# Patient Record
Sex: Male | Born: 1970 | Race: White | Hispanic: No | Marital: Married | Smoking: Never smoker
Health system: Southern US, Community
[De-identification: ages and names within clinical notes are randomized; demographics above are authoritative.]

## PROBLEM LIST (undated history)

## (undated) ENCOUNTER — Emergency Department (HOSPITAL_COMMUNITY): Payer: 59 | Source: Home / Self Care

## (undated) DIAGNOSIS — I10 Essential (primary) hypertension: Secondary | ICD-10-CM

## (undated) HISTORY — DX: Essential (primary) hypertension: I10

## (undated) HISTORY — PX: HERNIA REPAIR: SHX51

---

## 1999-05-18 ENCOUNTER — Ambulatory Visit (HOSPITAL_BASED_OUTPATIENT_CLINIC_OR_DEPARTMENT_OTHER): Admission: RE | Admit: 1999-05-18 | Discharge: 1999-05-18 | Payer: Self-pay | Admitting: Urology

## 2000-06-06 ENCOUNTER — Ambulatory Visit (HOSPITAL_BASED_OUTPATIENT_CLINIC_OR_DEPARTMENT_OTHER): Admission: RE | Admit: 2000-06-06 | Discharge: 2000-06-06 | Payer: Self-pay | Admitting: Urology

## 2001-06-11 ENCOUNTER — Encounter: Payer: Self-pay | Admitting: Specialist

## 2001-06-11 ENCOUNTER — Ambulatory Visit (HOSPITAL_COMMUNITY): Admission: RE | Admit: 2001-06-11 | Discharge: 2001-06-11 | Payer: Self-pay | Admitting: Specialist

## 2001-11-05 ENCOUNTER — Emergency Department (HOSPITAL_COMMUNITY): Admission: EM | Admit: 2001-11-05 | Discharge: 2001-11-05 | Payer: Self-pay | Admitting: *Deleted

## 2003-12-03 ENCOUNTER — Emergency Department (HOSPITAL_COMMUNITY): Admission: EM | Admit: 2003-12-03 | Discharge: 2003-12-03 | Payer: Self-pay | Admitting: Emergency Medicine

## 2004-11-07 ENCOUNTER — Emergency Department (HOSPITAL_COMMUNITY): Admission: EM | Admit: 2004-11-07 | Discharge: 2004-11-07 | Payer: Self-pay | Admitting: Family Medicine

## 2010-04-24 ENCOUNTER — Emergency Department (HOSPITAL_COMMUNITY): Admission: EM | Admit: 2010-04-24 | Discharge: 2010-04-24 | Payer: Self-pay | Admitting: Family Medicine

## 2011-07-04 ENCOUNTER — Telehealth: Payer: Self-pay | Admitting: *Deleted

## 2011-07-04 ENCOUNTER — Ambulatory Visit (INDEPENDENT_AMBULATORY_CARE_PROVIDER_SITE_OTHER): Payer: 59 | Admitting: Sports Medicine

## 2011-07-04 ENCOUNTER — Other Ambulatory Visit: Payer: Self-pay | Admitting: *Deleted

## 2011-07-04 VITALS — BP 124/88 | Ht 72.0 in | Wt 170.0 lb

## 2011-07-04 DIAGNOSIS — M722 Plantar fascial fibromatosis: Secondary | ICD-10-CM

## 2011-07-04 DIAGNOSIS — I10 Essential (primary) hypertension: Secondary | ICD-10-CM | POA: Insufficient documentation

## 2011-07-04 DIAGNOSIS — M79673 Pain in unspecified foot: Secondary | ICD-10-CM

## 2011-07-04 DIAGNOSIS — M79609 Pain in unspecified limb: Secondary | ICD-10-CM

## 2011-07-04 MED ORDER — MELOXICAM 15 MG PO TABS: 15.0000 mg | ORAL_TABLET | Freq: Every day | ORAL | Status: AC

## 2011-07-04 NOTE — Assessment & Plan Note (Signed)
I am hopeful that the conservative care with arch support and straps may lessen the swelling and help him get rhythm nodules. If not I think we should move ahead to a custom orthotic. He should do the icing at the end of each day.  Once these heal somewhat we will start him on some exercises for the plantar fascia as well.

## 2011-07-04 NOTE — Progress Notes (Signed)
  Subjective:    Patient ID: Thomas Kramer, male    DOB: [birth year], 41 y.o.   MRN: [id number]  HPI 1. Foot pain: Patient here with complaint of bilateral foot pain left greater than right. States pain in his left foot began approximately one and a half years ago and pain in right foot began approximately 3 months ago. Pain is most prominent in middle of long arches bilaterally. Says he does have pain when first getting up the morning and standing, with some "pulling" in his Achilles. Currently is employed at [employer] is required to wear steel toed shoes at work.   Review of Systems     Objective:   Physical Exam Gen: NAD L Foot:   Inspection and palpation: one large nodule on plantar aspect of mid-food, tender to palpation.  No tenderness along achilles or insertion of PF at calcaneus No swelling and no pain along metatarsal heads No pain with palpation of bony structures of mid foot: High long arch, transverse arch normal  R Foot Inspection and palpation: small nodule on plantar aspect of mid-food, tender to palpation.  No tenderness along achilles or insertion of PF at calcaneus No swelling and no pain along metatarsal heads No pain with palpation of bony structures of mid foot: High long arch, transverse arch normal  MSK [place]:   L Foot:  PF mildly thickened at insertion to calcaneus (0.6cm).  There are multiple fluid filled nodules in the mid arch medially. Plantar fascia also thickened to 0.3 cm at this area. Right foot: Plantar fascia at the calcaneal insertion measures 0.5 cm. There are also multiple fluid filled nodules along the plantar fascia the midportion of the long arch, plantar fascia is of normal thickness at this point.      Assessment & Plan:

## 2011-07-04 NOTE — Assessment & Plan Note (Addendum)
-  Multiple nodules on both feet suggestive of chronic plantar fasciitis, with some tearing of the plantar fascia -Does not appear fibromatous on MSK ultrasound -Fitted with support inserts with scaphoid and arch straps for support -Advised to ice in ice bath daily after work. -Return 4-6 weeks

## 2011-07-04 NOTE — Telephone Encounter (Signed)
Message copied by Mora Bellman on Tue Jul 04, 2011  5:09 PM ------      Message from: Lizbeth Bark      Created: Tue Jul 04, 2011  3:49 PM      Regarding: phone message      Contact: [phone]       Patient wife(jaime) called regarding this patient being in a lot of pain. She would like pain medicine called in, pharmacy [facility] [address]. He is on his feet 12 hours a day.

## 2011-07-04 NOTE — Telephone Encounter (Signed)
Dr. Darrick Penna ordered meloxicam qd prn.  Also advised aspercream up to 4 times per day, and emphasized ice baths daily after work.

## 2011-07-11 ENCOUNTER — Other Ambulatory Visit: Payer: Self-pay | Admitting: *Deleted

## 2011-07-11 MED ORDER — TRAMADOL HCL 50 MG PO TABS: 50.0000 mg | ORAL_TABLET | Freq: Three times a day (TID) | ORAL | Status: AC | PRN

## 2011-08-01 ENCOUNTER — Ambulatory Visit: Payer: 59 | Admitting: Sports Medicine

## 2013-10-23 ENCOUNTER — Encounter: Payer: Self-pay | Admitting: Podiatry

## 2013-10-23 ENCOUNTER — Ambulatory Visit (INDEPENDENT_AMBULATORY_CARE_PROVIDER_SITE_OTHER): Payer: 59

## 2013-10-23 ENCOUNTER — Ambulatory Visit (INDEPENDENT_AMBULATORY_CARE_PROVIDER_SITE_OTHER): Payer: 59 | Admitting: Podiatry

## 2013-10-23 VITALS — BP 178/98 | HR 86 | Resp 15 | Ht 72.0 in | Wt 174.0 lb

## 2013-10-23 DIAGNOSIS — M722 Plantar fascial fibromatosis: Secondary | ICD-10-CM

## 2013-10-23 MED ORDER — TRIAMCINOLONE ACETONIDE 10 MG/ML IJ SUSP
10.0000 mg | Freq: Once | INTRAMUSCULAR | Status: AC
  Administered 2013-10-23: 10 mg

## 2013-10-23 NOTE — Progress Notes (Signed)
   Subjective:    Patient ID: Thomas Kramer, male    DOB: 10-26-1970, 44 y.o.   MRN: [id number]  HPI Comments: N plantar fibromas L B/L plantar fascia medial  D greater than 6 months O C lumps, and pain A long periods of standing on hard floors, and steel toed shoes T changing shoes, OTC orthotics     Review of Systems  All other systems reviewed and are negative.      Objective:   Physical Exam        Assessment & Plan:

## 2013-10-24 NOTE — Progress Notes (Signed)
Subjective:     Patient ID: Thomas Kramer, male   DOB: 1970/12/20, 43 y.o.   MRN: [id number]  HPI patient presents stating I have been nodules on the bottom of both my arches better becoming increasingly tender over the last 6 months. States he's not sure if they've grown but they are painful when he walks on cement floors   Review of Systems  All other systems reviewed and are negative.      Objective:   Physical Exam  Nursing note and vitals reviewed. Constitutional: He is oriented to person, place, and time.  Cardiovascular: Intact distal pulses.   Musculoskeletal: Normal range of motion.  Neurological: He is oriented to person, place, and time.  Skin: Skin is warm.   neurovascular status intact with muscle strength adequate and range of motion of the subtalar and midtarsal joint within normal limits. Patient is found to have nodules in the plantar arch measuring about 4 cm in length on both feet with inflammation and discomfort when they are palpated. 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 Explained condition and reviewed with patient causes of these and the fact the only way to definitively no is with biopsy and removal of the masses. He understands this but first wants to try conservative care understanding risk. Today I infiltrated underneath the areas 3 mg Kenalog 5 mg Xylocaine Marcaine mixture to try to reduce the inflammation and see if we can help his problem. Also we scanned him for custom orthotics to try to reduce pressure on the plantar arch region and I did dispense a night splint for stretch of the plantar fascia. Reappoint when orthotics are ready

## 2013-11-10 ENCOUNTER — Encounter: Payer: Self-pay | Admitting: *Deleted

## 2013-11-10 ENCOUNTER — Ambulatory Visit: Payer: 59 | Admitting: *Deleted

## 2013-11-10 VITALS — BP 132/96 | HR 80 | Resp 14 | Ht 72.0 in | Wt 175.0 lb

## 2013-11-10 DIAGNOSIS — M722 Plantar fascial fibromatosis: Secondary | ICD-10-CM

## 2013-11-10 NOTE — Progress Notes (Signed)
   Subjective:    Patient ID: Thomas Kramer, male    DOB: 1970/12/29, 43 y.o.   MRN: [id number]  HPI Comments: Pt presents for orthotic pick up.  Oral and written wearing instruction wear given.  Orthotics were placed in pt's workshoes and pt states he has a follow up appt scheduled.     Review of Systems     Objective:   Physical Exam        Assessment & Plan:

## 2013-11-10 NOTE — Patient Instructions (Signed)

## 2013-12-08 ENCOUNTER — Ambulatory Visit: Payer: 59 | Admitting: Podiatry

## 2013-12-25 ENCOUNTER — Ambulatory Visit (INDEPENDENT_AMBULATORY_CARE_PROVIDER_SITE_OTHER): Payer: 59 | Admitting: Podiatry

## 2013-12-25 ENCOUNTER — Encounter: Payer: Self-pay | Admitting: Podiatry

## 2013-12-25 VITALS — BP 119/97 | HR 67 | Resp 16

## 2013-12-25 DIAGNOSIS — M722 Plantar fascial fibromatosis: Secondary | ICD-10-CM

## 2013-12-27 NOTE — Progress Notes (Signed)
Subjective:     Patient ID: Thomas Kramer, male   DOB: May 16, 1971, 43 y.o.   MRN: [id number]  HPI patient states that he is improving but he does not have time to use and one night splint for both feet and would prefer to get a second night splint so that he can wear them both at the same time   Review of Systems     Objective:   Physical Exam Neurovascular status intact with diminished discomfort in the plantar heels but still does have pain after periods of sitting or in the morning    Assessment:     Plantar fasciitis improving bilateral    Plan:     Instructed on continued physical therapy orthotic usage and dispensed second night splint for usage

## 2014-08-18 ENCOUNTER — Ambulatory Visit (INDEPENDENT_AMBULATORY_CARE_PROVIDER_SITE_OTHER): Payer: 59 | Admitting: Podiatry

## 2014-08-18 ENCOUNTER — Other Ambulatory Visit: Payer: Self-pay | Admitting: Podiatry

## 2014-08-18 ENCOUNTER — Encounter: Payer: Self-pay | Admitting: Podiatry

## 2014-08-18 ENCOUNTER — Ambulatory Visit (INDEPENDENT_AMBULATORY_CARE_PROVIDER_SITE_OTHER): Payer: 59

## 2014-08-18 VITALS — BP 126/67 | HR 72 | Resp 16

## 2014-08-18 DIAGNOSIS — M722 Plantar fascial fibromatosis: Secondary | ICD-10-CM

## 2014-08-18 DIAGNOSIS — M779 Enthesopathy, unspecified: Secondary | ICD-10-CM

## 2014-08-18 MED ORDER — TRIAMCINOLONE ACETONIDE 10 MG/ML IJ SUSP
10.0000 mg | Freq: Once | INTRAMUSCULAR | Status: AC
Start: 2014-08-18 — End: 2014-08-18
  Administered 2014-08-18: 10 mg

## 2014-08-18 MED ORDER — TRAMADOL HCL 50 MG PO TABS: 50.0000 mg | ORAL_TABLET | Freq: Three times a day (TID) | ORAL | Status: DC

## 2014-08-18 NOTE — Progress Notes (Signed)
Subjective:     Patient ID: Thomas Kramer, male   DOB: 07-13-1970, 44 y.o.   MRN: [id number]  HPI patient presents stating my arch is really bothering me on my right now I'm walking differently and developing pain on the outside of my foot and these nodules have never stopped hurting. States the nodules or worse on the right versus the left and they've been going on for several years and he is at the point now or he wants to consider removal   Review of Systems     Objective:   Physical Exam Neurovascular status intact with muscle strength adequate and range of motion subtalar midtarsal joint within normal limits. Patient's noted to have nodular masses within the right mid arch area measuring about 1 cm x 1 cm that is very tender when pressed and a small mass on the left that's approximately 8 mm x 8 mm that's not currently tender. 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 I did discuss that ultimately I do think this is can require excision and he is going to talk to them at work and we are get a make a decision next week

## 2014-08-21 ENCOUNTER — Telehealth: Payer: Self-pay | Admitting: *Deleted

## 2014-08-21 NOTE — Telephone Encounter (Signed)
Pt's wife, Roselyn Reef called stating the Tramadol was not helping the pain.  I called [phone] left a message informing I would call the [phone] number.  I left a message on the [phone] and informed that Tramadol was usually the strongest given for this particular problem, that if he was able to take regular strength Tylenol, to take it as the package directs and to ice the areas 3 - 4 times a day for 10 -15 mins.

## 2014-08-25 ENCOUNTER — Encounter: Payer: Self-pay | Admitting: Podiatry

## 2014-08-25 ENCOUNTER — Ambulatory Visit (INDEPENDENT_AMBULATORY_CARE_PROVIDER_SITE_OTHER): Payer: 59 | Admitting: Podiatry

## 2014-08-25 VITALS — BP 140/98 | HR 86 | Resp 18

## 2014-08-25 DIAGNOSIS — M779 Enthesopathy, unspecified: Secondary | ICD-10-CM | POA: Diagnosis not present

## 2014-08-25 DIAGNOSIS — M722 Plantar fascial fibromatosis: Secondary | ICD-10-CM | POA: Diagnosis not present

## 2014-08-25 NOTE — Progress Notes (Signed)
Subjective:     Patient ID: Thomas Kramer, male   DOB: [birth year], 44 y.o.   MRN: [id number]  HPI patient presents with nodules in the plantar aspect of the right arch that remains very tender and did not respond to injection treatment. Also has a lot of pain on the outside of his right foot secondary to walking differently and he states that this is been going on for several years and he is simply tired of the pain that he experiences   Review of Systems     Objective:   Physical Exam Neurovascular status intact with digits well perfused and 2 different nodules in the plantar aspect of the right fascia one distal and one in the mid arch area both measuring about 1 cm x 1 cm and very painful when pressed area pain in the outside of the right foot secondary to compensation of the inside of the foot    Assessment:     Probable plantar fibromas right that are increasingly symptomatic with an inability to walk on that side of the foot and gradual increase in the lateral inflammation and tendinitis    Plan:     Reviewed condition and at this point due to the long-term nature of this problem I have recommended excision of these nodules with pathology. I did explain the procedure to patient and risk and spent a great of time going over the consent form reviewing alternative treatments and the surgery itself. Patient wants procedure understanding the told recovery from this can take up to 1 year and that there is no long-term guarantees it will not recur and he signs consent form after review. Dispensed air fracture walker with all instructions on usage and he is scheduled for surgery in the next 2 weeks and is encouraged to call with questions if he has any prior to procedure

## 2014-08-26 ENCOUNTER — Telehealth: Payer: Self-pay | Admitting: Podiatry

## 2014-08-26 ENCOUNTER — Telehealth: Payer: Self-pay | Admitting: *Deleted

## 2014-08-26 NOTE — Telephone Encounter (Signed)
Pt called stating he needs a note for his job stating he is going to have surgery on 09/08/14 with Dr Paulla Dolly and estimated time he will be out of work. Please call pt back.

## 2014-08-26 NOTE — Telephone Encounter (Signed)
Pt states the [facility] for [facility] needs a note with the date of his surgery 09/08/2014 and the length of time he will be out faxed to [phone].

## 2014-08-26 NOTE — Telephone Encounter (Signed)
"  I'm a patient of Dr. Mellody Drown.  I was there yesterday."  You want to have surgery on 09/08/2014.  "That's correct, so you already know.  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  I work at [employer]).  You don't have to put down the name of the surgery or anything like that."  I will get Marcie Bal to take care of that for you.  Do you have a fax number?  "Yes, it is [phone] and you can put it attention Dawn."  Okay, we'll take care of it for you. ( Patient is having a Plantar Fibroma right.)  I called and left a message for patient asking how long he will need to be out of work or how long did Dr. Paulla Dolly tell him he would need to be out.  Please call and let Marcie Bal know.

## 2014-08-27 ENCOUNTER — Encounter: Payer: Self-pay | Admitting: Podiatry

## 2014-08-27 NOTE — Telephone Encounter (Signed)
Approx. 12 weeks

## 2014-09-01 NOTE — Telephone Encounter (Signed)
8-12 weeks.

## 2014-09-08 ENCOUNTER — Encounter: Payer: Self-pay | Admitting: Podiatry

## 2014-09-08 DIAGNOSIS — D492 Neoplasm of unspecified behavior of bone, soft tissue, and skin: Secondary | ICD-10-CM | POA: Diagnosis not present

## 2014-09-14 ENCOUNTER — Encounter: Payer: Self-pay | Admitting: Podiatry

## 2014-09-14 ENCOUNTER — Ambulatory Visit (INDEPENDENT_AMBULATORY_CARE_PROVIDER_SITE_OTHER): Payer: 59 | Admitting: Podiatry

## 2014-09-14 VITALS — BP 129/94 | HR 81

## 2014-09-14 DIAGNOSIS — M722 Plantar fascial fibromatosis: Secondary | ICD-10-CM

## 2014-09-14 MED ORDER — HYDROCODONE-ACETAMINOPHEN 10-325 MG PO TABS: 1.0000 | ORAL_TABLET | Freq: Three times a day (TID) | ORAL | Status: DC | PRN

## 2014-09-14 NOTE — Progress Notes (Signed)
   Subjective:    Patient ID: Thomas Kramer, male    DOB: 03-Sep-1970, 44 y.o.   MRN: [id number]  HPI  Ist post op visit,   Review of Systems  All other systems reviewed and are negative.      Objective:   Physical Exam        Assessment & Plan:

## 2014-09-14 NOTE — Progress Notes (Signed)
Subjective:     Patient ID: Thomas Kramer, male   DOB: February 15, 1971, 44 y.o.   MRN: [id number]  HPI patient states I'm doing well with my foot and I been doing everything he is said as far as nonweightbearing goes and I'm not having much discomfort   Review of Systems     Objective:   Physical Exam Neurovascular status intact negative Homans sign noted with all stitches intact and wound edges well coapted right    Assessment:     Doing well post fibro-lesion removal plantar aspect right foot    Plan:     Cleaning the area up reapplied sterile dressing instructed on continued nonweightbearing and reappoint 2 weeks for suture removal. Please a point earlier if any issues should occur

## 2014-09-16 ENCOUNTER — Encounter: Payer: Self-pay | Admitting: Podiatry

## 2014-09-16 NOTE — Progress Notes (Signed)
DOS 09/08/2014 Removal of Plantar Fibroma right foot, Injection right lateral foot.

## 2014-09-23 DIAGNOSIS — R52 Pain, unspecified: Secondary | ICD-10-CM

## 2014-09-28 ENCOUNTER — Encounter: Payer: Self-pay | Admitting: Podiatry

## 2014-09-28 ENCOUNTER — Ambulatory Visit (INDEPENDENT_AMBULATORY_CARE_PROVIDER_SITE_OTHER): Payer: 59 | Admitting: Podiatry

## 2014-09-28 DIAGNOSIS — M722 Plantar fascial fibromatosis: Secondary | ICD-10-CM

## 2014-09-28 MED ORDER — HYDROCODONE-ACETAMINOPHEN 10-325 MG PO TABS: 1.0000 | ORAL_TABLET | Freq: Three times a day (TID) | ORAL | Status: DC | PRN

## 2014-09-30 NOTE — Progress Notes (Signed)
Subjective:     Patient ID: Thomas Kramer, male   DOB: 1971/03/30, 44 y.o.   MRN: [id number]  HPI patient states I'm doing well with my right foot and I would like to get the left one fixed as soon as possible as I need to return to work as quickly as I can't   Review of Systems     Objective:   Physical Exam Neurovascular status intact negative Homans sign noted with well coapted incision site plantar aspect right arch with wound edges in good alignment and stitches intact and minimal edema at the current time or pain when pressed. Left foot shows a nodule in the mid arch area measuring about 1 x 1 cm that's very painful    Assessment:     Doing well from plantar fibroma excision right and plantar fibroma left present    Plan:     Stitches removed right wound edges remain coapted well and sterile dressing reapplied. Continue with immobilization and surgical shoe dispensed with gradual increase of full weightbearing over the next week. I went ahead and I allowed him to review consent form for the left foot for excision of fibroma reviewing alternative treatments and complications and the fact there is no long-term guarantees. Patient wants procedure signs consent form and is scheduled for outpatient surgery understanding total recovery period will be 6 months to one year and is encouraged to call with questions

## 2014-10-06 ENCOUNTER — Encounter: Payer: Self-pay | Admitting: Podiatry

## 2014-10-06 DIAGNOSIS — M722 Plantar fascial fibromatosis: Secondary | ICD-10-CM | POA: Diagnosis not present

## 2014-10-06 NOTE — Progress Notes (Signed)
DOS 10/06/2014 Left plantar fibroma.

## 2014-10-12 ENCOUNTER — Ambulatory Visit (INDEPENDENT_AMBULATORY_CARE_PROVIDER_SITE_OTHER): Payer: 59 | Admitting: Podiatry

## 2014-10-12 DIAGNOSIS — M722 Plantar fascial fibromatosis: Secondary | ICD-10-CM

## 2014-10-12 MED ORDER — HYDROCODONE-ACETAMINOPHEN 10-325 MG PO TABS: 1.0000 | ORAL_TABLET | Freq: Three times a day (TID) | ORAL | Status: DC | PRN

## 2014-10-13 NOTE — Progress Notes (Signed)
Subjective:     Patient ID: Thomas Kramer, male   DOB: [birth year], 44 y.o.   MRN: [id number]  HPI patient states I'm doing well with my foot with some discomfort and swelling   Review of Systems     Objective:   Physical Exam Neurovascular status intact negative Homans sign noted with well-healing surgical site plantar aspect left arch and right that's healing very well from surgery done approximate 5 weeks ago. Wound edges are well coapted and stitches are in place    Assessment:     Doing well post fibroma excision left and right    Plan:     Reapplied sterile dressing left continue with immobilization elevation and reappoint 2 weeks for suture removal or earlier if any issues should occur

## 2014-10-22 ENCOUNTER — Encounter: Payer: Self-pay | Admitting: Podiatry

## 2014-10-26 ENCOUNTER — Ambulatory Visit (INDEPENDENT_AMBULATORY_CARE_PROVIDER_SITE_OTHER): Payer: 59 | Admitting: Podiatry

## 2014-10-26 DIAGNOSIS — M722 Plantar fascial fibromatosis: Secondary | ICD-10-CM

## 2014-10-26 MED ORDER — TRAMADOL HCL 50 MG PO TABS: 50.0000 mg | ORAL_TABLET | Freq: Three times a day (TID) | ORAL | Status: DC

## 2014-10-27 NOTE — Progress Notes (Signed)
Subjective:     Patient ID: Thomas Kramer, male   DOB: 01/19/1971, 44 y.o.   MRN: [id number]  HPI patient states I'm doing well with my left foot and the right one feels really good   Review of Systems     Objective:   Physical Exam Neurovascular status intact incision sites are found to be well coapted negative Homans sign and is noted with stitches still intact left foot    Assessment:     Doing well from plantar fibroma surgery left    Plan:     Stitches removed left wound edges coapted well sterile dressings applied and Ace wrap applied. Continue elevation and gradual increase in activities

## 2014-11-27 ENCOUNTER — Ambulatory Visit (INDEPENDENT_AMBULATORY_CARE_PROVIDER_SITE_OTHER): Payer: 59 | Admitting: Podiatry

## 2014-11-27 ENCOUNTER — Encounter: Payer: Self-pay | Admitting: Podiatry

## 2014-11-27 VITALS — BP 151/95 | HR 82 | Resp 16

## 2014-11-27 DIAGNOSIS — M779 Enthesopathy, unspecified: Secondary | ICD-10-CM

## 2014-11-27 MED ORDER — HYDROCODONE-ACETAMINOPHEN 10-325 MG PO TABS: 1.0000 | ORAL_TABLET | Freq: Four times a day (QID) | ORAL | Status: DC | PRN

## 2014-11-27 NOTE — Progress Notes (Signed)
Subjective:     Patient ID: Thomas Kramer, male   DOB: September 18, 1970, 44 y.o.   MRN: [id number]  HPI patient states I'm doing pretty well but I need to be able to stretch my other foot also. I need a splint. I do get pain at the end of the day   Review of Systems     Objective:   Physical Exam Neurovascular status intact muscle strength adequate with discomfort in the fascia left over right with a plantar fibromas removed with mild edema and no other significant problems noted    Assessment:     Doing well but patient is due to return to work and still has some inflammatory processes going on    Plan:     Tendinitis of which I described in great deal to him today. I've recommended topical treatment which was dispensed and I dispensed night splint for the left along with instructions on physical therapy. Placed on Vicodin to be taken just at night and reappoint in 3 months or earlier if necessary

## 2014-12-07 ENCOUNTER — Encounter: Payer: 59 | Admitting: Podiatry

## 2015-03-04 ENCOUNTER — Ambulatory Visit (INDEPENDENT_AMBULATORY_CARE_PROVIDER_SITE_OTHER): Payer: Commercial Managed Care - HMO | Admitting: Podiatry

## 2015-03-04 ENCOUNTER — Encounter: Payer: Self-pay | Admitting: Podiatry

## 2015-03-04 VITALS — BP 128/89 | HR 86 | Resp 16

## 2015-03-04 DIAGNOSIS — M779 Enthesopathy, unspecified: Secondary | ICD-10-CM

## 2015-03-04 DIAGNOSIS — M722 Plantar fascial fibromatosis: Secondary | ICD-10-CM

## 2015-03-04 NOTE — Progress Notes (Signed)
Subjective:     Patient ID: Thomas Kramer, male   DOB: July 18, 1970, 44 y.o.   MRN: [id number]  HPI patient states she's doing well with significant reduction of discomfort bilateral   Review of Systems     Objective:   Physical Exam Neurovascular status intact with excellent healing of the plantar sites arch bilateral    Assessment:     Doing well after plantar fibroma excision bilateral    Plan:     Patient's discharge at this time still has slight numbness which should get better over the next 6 months and will reappoint if any issues should occur

## 2016-08-11 DIAGNOSIS — Z5181 Encounter for therapeutic drug level monitoring: Secondary | ICD-10-CM | POA: Diagnosis not present

## 2016-08-11 DIAGNOSIS — Z7689 Persons encountering health services in other specified circumstances: Secondary | ICD-10-CM | POA: Diagnosis not present

## 2016-08-11 DIAGNOSIS — Z79899 Other long term (current) drug therapy: Secondary | ICD-10-CM | POA: Diagnosis not present

## 2016-08-21 DIAGNOSIS — Z5181 Encounter for therapeutic drug level monitoring: Secondary | ICD-10-CM | POA: Diagnosis not present

## 2016-08-21 DIAGNOSIS — Z79899 Other long term (current) drug therapy: Secondary | ICD-10-CM | POA: Diagnosis not present

## 2016-09-01 DIAGNOSIS — Z79899 Other long term (current) drug therapy: Secondary | ICD-10-CM | POA: Diagnosis not present

## 2016-09-01 DIAGNOSIS — Z5181 Encounter for therapeutic drug level monitoring: Secondary | ICD-10-CM | POA: Diagnosis not present

## 2016-09-22 DIAGNOSIS — Z5181 Encounter for therapeutic drug level monitoring: Secondary | ICD-10-CM | POA: Diagnosis not present

## 2016-09-22 DIAGNOSIS — Z79899 Other long term (current) drug therapy: Secondary | ICD-10-CM | POA: Diagnosis not present

## 2016-10-20 DIAGNOSIS — Z5181 Encounter for therapeutic drug level monitoring: Secondary | ICD-10-CM | POA: Diagnosis not present

## 2016-10-20 DIAGNOSIS — Z79899 Other long term (current) drug therapy: Secondary | ICD-10-CM | POA: Diagnosis not present

## 2016-11-17 DIAGNOSIS — Z5181 Encounter for therapeutic drug level monitoring: Secondary | ICD-10-CM | POA: Diagnosis not present

## 2016-11-17 DIAGNOSIS — Z79899 Other long term (current) drug therapy: Secondary | ICD-10-CM | POA: Diagnosis not present

## 2016-11-27 DIAGNOSIS — I10 Essential (primary) hypertension: Secondary | ICD-10-CM | POA: Diagnosis not present

## 2016-11-27 DIAGNOSIS — L408 Other psoriasis: Secondary | ICD-10-CM | POA: Diagnosis not present

## 2016-12-11 DIAGNOSIS — Z79899 Other long term (current) drug therapy: Secondary | ICD-10-CM | POA: Diagnosis not present

## 2017-03-08 DIAGNOSIS — Z79899 Other long term (current) drug therapy: Secondary | ICD-10-CM | POA: Diagnosis not present

## 2017-03-29 DIAGNOSIS — L814 Other melanin hyperpigmentation: Secondary | ICD-10-CM | POA: Diagnosis not present

## 2017-03-29 DIAGNOSIS — D225 Melanocytic nevi of trunk: Secondary | ICD-10-CM | POA: Diagnosis not present

## 2017-03-29 DIAGNOSIS — D485 Neoplasm of uncertain behavior of skin: Secondary | ICD-10-CM | POA: Diagnosis not present

## 2017-03-29 DIAGNOSIS — D1801 Hemangioma of skin and subcutaneous tissue: Secondary | ICD-10-CM | POA: Diagnosis not present

## 2017-03-29 DIAGNOSIS — L300 Nummular dermatitis: Secondary | ICD-10-CM | POA: Diagnosis not present

## 2017-04-05 DIAGNOSIS — Z79899 Other long term (current) drug therapy: Secondary | ICD-10-CM | POA: Diagnosis not present

## 2017-04-23 DIAGNOSIS — H04123 Dry eye syndrome of bilateral lacrimal glands: Secondary | ICD-10-CM | POA: Diagnosis not present

## 2017-04-23 DIAGNOSIS — H5213 Myopia, bilateral: Secondary | ICD-10-CM | POA: Diagnosis not present

## 2017-05-03 DIAGNOSIS — Z79899 Other long term (current) drug therapy: Secondary | ICD-10-CM | POA: Diagnosis not present

## 2017-05-16 DIAGNOSIS — Z0000 Encounter for general adult medical examination without abnormal findings: Secondary | ICD-10-CM | POA: Diagnosis not present

## 2017-05-16 DIAGNOSIS — I10 Essential (primary) hypertension: Secondary | ICD-10-CM | POA: Diagnosis not present

## 2017-05-16 DIAGNOSIS — R7309 Other abnormal glucose: Secondary | ICD-10-CM | POA: Diagnosis not present

## 2017-05-24 DIAGNOSIS — Z0000 Encounter for general adult medical examination without abnormal findings: Secondary | ICD-10-CM | POA: Diagnosis not present

## 2017-05-24 DIAGNOSIS — E291 Testicular hypofunction: Secondary | ICD-10-CM | POA: Diagnosis not present

## 2017-05-24 DIAGNOSIS — I10 Essential (primary) hypertension: Secondary | ICD-10-CM | POA: Diagnosis not present

## 2017-05-31 DIAGNOSIS — Z79899 Other long term (current) drug therapy: Secondary | ICD-10-CM | POA: Diagnosis not present

## 2017-06-28 DIAGNOSIS — Z79899 Other long term (current) drug therapy: Secondary | ICD-10-CM | POA: Diagnosis not present

## 2017-07-26 DIAGNOSIS — Z79899 Other long term (current) drug therapy: Secondary | ICD-10-CM | POA: Diagnosis not present

## 2017-08-06 DIAGNOSIS — Z79899 Other long term (current) drug therapy: Secondary | ICD-10-CM | POA: Diagnosis not present

## 2017-08-16 DIAGNOSIS — Z79899 Other long term (current) drug therapy: Secondary | ICD-10-CM | POA: Diagnosis not present

## 2017-08-16 DIAGNOSIS — Z5181 Encounter for therapeutic drug level monitoring: Secondary | ICD-10-CM | POA: Diagnosis not present

## 2017-09-06 DIAGNOSIS — Z79899 Other long term (current) drug therapy: Secondary | ICD-10-CM | POA: Diagnosis not present

## 2017-10-01 DIAGNOSIS — I10 Essential (primary) hypertension: Secondary | ICD-10-CM | POA: Diagnosis not present

## 2017-10-01 DIAGNOSIS — Z6824 Body mass index (BMI) 24.0-24.9, adult: Secondary | ICD-10-CM | POA: Diagnosis not present

## 2017-10-01 DIAGNOSIS — K047 Periapical abscess without sinus: Secondary | ICD-10-CM | POA: Diagnosis not present

## 2017-10-04 DIAGNOSIS — Z79899 Other long term (current) drug therapy: Secondary | ICD-10-CM | POA: Diagnosis not present

## 2017-11-01 DIAGNOSIS — Z79899 Other long term (current) drug therapy: Secondary | ICD-10-CM | POA: Diagnosis not present

## 2017-11-15 DIAGNOSIS — R7309 Other abnormal glucose: Secondary | ICD-10-CM | POA: Diagnosis not present

## 2017-11-15 DIAGNOSIS — K047 Periapical abscess without sinus: Secondary | ICD-10-CM | POA: Diagnosis not present

## 2017-11-15 DIAGNOSIS — I10 Essential (primary) hypertension: Secondary | ICD-10-CM | POA: Diagnosis not present

## 2017-11-29 DIAGNOSIS — Z79899 Other long term (current) drug therapy: Secondary | ICD-10-CM | POA: Diagnosis not present

## 2017-12-26 DIAGNOSIS — Z79899 Other long term (current) drug therapy: Secondary | ICD-10-CM | POA: Diagnosis not present

## 2018-01-04 DIAGNOSIS — Z1212 Encounter for screening for malignant neoplasm of rectum: Secondary | ICD-10-CM | POA: Diagnosis not present

## 2018-01-04 LAB — IFOBT (OCCULT BLOOD): IFOBT: POSITIVE

## 2018-01-23 DIAGNOSIS — Z79899 Other long term (current) drug therapy: Secondary | ICD-10-CM | POA: Diagnosis not present

## 2018-02-21 DIAGNOSIS — Z79899 Other long term (current) drug therapy: Secondary | ICD-10-CM | POA: Diagnosis not present

## 2018-02-22 DIAGNOSIS — L300 Nummular dermatitis: Secondary | ICD-10-CM | POA: Diagnosis not present

## 2018-03-13 DIAGNOSIS — T1512XA Foreign body in conjunctival sac, left eye, initial encounter: Secondary | ICD-10-CM | POA: Diagnosis not present

## 2018-03-21 DIAGNOSIS — Z79899 Other long term (current) drug therapy: Secondary | ICD-10-CM | POA: Diagnosis not present

## 2018-03-29 DIAGNOSIS — C44619 Basal cell carcinoma of skin of left upper limb, including shoulder: Secondary | ICD-10-CM | POA: Diagnosis not present

## 2018-03-29 DIAGNOSIS — L814 Other melanin hyperpigmentation: Secondary | ICD-10-CM | POA: Diagnosis not present

## 2018-03-29 DIAGNOSIS — D225 Melanocytic nevi of trunk: Secondary | ICD-10-CM | POA: Diagnosis not present

## 2018-03-29 DIAGNOSIS — D485 Neoplasm of uncertain behavior of skin: Secondary | ICD-10-CM | POA: Diagnosis not present

## 2018-03-29 DIAGNOSIS — L570 Actinic keratosis: Secondary | ICD-10-CM | POA: Diagnosis not present

## 2018-03-29 DIAGNOSIS — L300 Nummular dermatitis: Secondary | ICD-10-CM | POA: Diagnosis not present

## 2018-03-29 DIAGNOSIS — L820 Inflamed seborrheic keratosis: Secondary | ICD-10-CM | POA: Diagnosis not present

## 2018-04-18 DIAGNOSIS — Z79899 Other long term (current) drug therapy: Secondary | ICD-10-CM | POA: Diagnosis not present

## 2018-04-23 DIAGNOSIS — H5213 Myopia, bilateral: Secondary | ICD-10-CM | POA: Diagnosis not present

## 2018-04-23 DIAGNOSIS — H04123 Dry eye syndrome of bilateral lacrimal glands: Secondary | ICD-10-CM | POA: Diagnosis not present

## 2018-04-24 DIAGNOSIS — J029 Acute pharyngitis, unspecified: Secondary | ICD-10-CM | POA: Diagnosis not present

## 2018-05-16 DIAGNOSIS — Z79899 Other long term (current) drug therapy: Secondary | ICD-10-CM | POA: Diagnosis not present

## 2018-05-24 DIAGNOSIS — R82998 Other abnormal findings in urine: Secondary | ICD-10-CM | POA: Diagnosis not present

## 2018-05-24 DIAGNOSIS — Z0000 Encounter for general adult medical examination without abnormal findings: Secondary | ICD-10-CM | POA: Diagnosis not present

## 2018-05-31 DIAGNOSIS — I10 Essential (primary) hypertension: Secondary | ICD-10-CM | POA: Diagnosis not present

## 2018-05-31 DIAGNOSIS — Z23 Encounter for immunization: Secondary | ICD-10-CM | POA: Diagnosis not present

## 2018-05-31 DIAGNOSIS — Z0000 Encounter for general adult medical examination without abnormal findings: Secondary | ICD-10-CM | POA: Diagnosis not present

## 2018-05-31 DIAGNOSIS — E781 Pure hyperglyceridemia: Secondary | ICD-10-CM | POA: Diagnosis not present

## 2018-05-31 DIAGNOSIS — R739 Hyperglycemia, unspecified: Secondary | ICD-10-CM | POA: Diagnosis not present

## 2018-05-31 DIAGNOSIS — Z1389 Encounter for screening for other disorder: Secondary | ICD-10-CM | POA: Diagnosis not present

## 2018-06-13 DIAGNOSIS — Z79899 Other long term (current) drug therapy: Secondary | ICD-10-CM | POA: Diagnosis not present

## 2018-07-11 DIAGNOSIS — Z79899 Other long term (current) drug therapy: Secondary | ICD-10-CM | POA: Diagnosis not present

## 2018-08-08 DIAGNOSIS — Z79899 Other long term (current) drug therapy: Secondary | ICD-10-CM | POA: Diagnosis not present

## 2018-09-05 DIAGNOSIS — Z79899 Other long term (current) drug therapy: Secondary | ICD-10-CM | POA: Diagnosis not present

## 2019-05-29 ENCOUNTER — Other Ambulatory Visit: Payer: Self-pay

## 2019-05-29 DIAGNOSIS — Z20822 Contact with and (suspected) exposure to covid-19: Secondary | ICD-10-CM

## 2019-06-01 LAB — NOVEL CORONAVIRUS, NAA: SARS-CoV-2, NAA: NOT DETECTED

## 2019-06-07 ENCOUNTER — Other Ambulatory Visit: Payer: Self-pay

## 2019-06-07 ENCOUNTER — Emergency Department (HOSPITAL_COMMUNITY): Payer: 59

## 2019-06-07 ENCOUNTER — Encounter (HOSPITAL_COMMUNITY): Payer: Self-pay | Admitting: Radiology

## 2019-06-07 ENCOUNTER — Observation Stay (HOSPITAL_COMMUNITY)
Admission: EM | Admit: 2019-06-07 | Discharge: 2019-06-09 | Disposition: A | Discharge status: Home / Self Care | Payer: 59 | Attending: Internal Medicine | Admitting: Internal Medicine

## 2019-06-07 DIAGNOSIS — D72829 Elevated white blood cell count, unspecified: Secondary | ICD-10-CM | POA: Diagnosis not present

## 2019-06-07 DIAGNOSIS — R0902 Hypoxemia: Secondary | ICD-10-CM

## 2019-06-07 DIAGNOSIS — J9601 Acute respiratory failure with hypoxia: Principal | ICD-10-CM | POA: Diagnosis present

## 2019-06-07 DIAGNOSIS — Z20828 Contact with and (suspected) exposure to other viral communicable diseases: Secondary | ICD-10-CM | POA: Insufficient documentation

## 2019-06-07 DIAGNOSIS — R0602 Shortness of breath: Secondary | ICD-10-CM | POA: Diagnosis present

## 2019-06-07 DIAGNOSIS — J45909 Unspecified asthma, uncomplicated: Secondary | ICD-10-CM | POA: Diagnosis present

## 2019-06-07 DIAGNOSIS — Z79899 Other long term (current) drug therapy: Secondary | ICD-10-CM | POA: Insufficient documentation

## 2019-06-07 DIAGNOSIS — I10 Essential (primary) hypertension: Secondary | ICD-10-CM | POA: Diagnosis present

## 2019-06-07 DIAGNOSIS — R062 Wheezing: Secondary | ICD-10-CM

## 2019-06-07 LAB — COMPREHENSIVE METABOLIC PANEL
ALT: 21 U/L (ref 0–44)
AST: 19 U/L (ref 15–41)
Albumin: 4.3 g/dL (ref 3.5–5.0)
Alkaline Phosphatase: 39 U/L (ref 38–126)
Anion gap: 10 (ref 5–15)
BUN: 6 mg/dL (ref 6–20)
CO2: 25 mmol/L (ref 22–32)
Calcium: 8.6 mg/dL — ABNORMAL LOW (ref 8.9–10.3)
Chloride: 98 mmol/L (ref 98–111)
Creatinine, Ser: 0.66 mg/dL (ref 0.61–1.24)
GFR calc Af Amer: >60 mL/min (ref >60)
GFR calc non Af Amer: >60 mL/min (ref >60)
Glucose, Bld: 179 mg/dL — ABNORMAL HIGH (ref 70–99)
Potassium: 3.8 mmol/L (ref 3.5–5.1)
Sodium: 133 mmol/L — ABNORMAL LOW (ref 135–145)
Total Bilirubin: 0.6 mg/dL (ref 0.3–1.2)
Total Protein: 7.4 g/dL (ref 6.5–8.1)

## 2019-06-07 LAB — CBC WITH DIFFERENTIAL/PLATELET
Abs Immature Granulocytes: 0.04 10*3/uL (ref 0.00–0.07)
Basophils Absolute: 0.1 10*3/uL (ref 0.0–0.1)
Basophils Relative: 1 %
Eosinophils Absolute: 0.7 10*3/uL — ABNORMAL HIGH (ref 0.0–0.5)
Eosinophils Relative: 7 %
HCT: 43.7 % (ref 39.0–52.0)
Hemoglobin: 14.8 g/dL (ref 13.0–17.0)
Immature Granulocytes: 0 %
Lymphocytes Relative: 18 %
Lymphs Abs: 1.9 10*3/uL (ref 0.7–4.0)
MCH: 31.7 pg (ref 26.0–34.0)
MCHC: 33.9 g/dL (ref 30.0–36.0)
MCV: 93.6 fL (ref 80.0–100.0)
Monocytes Absolute: 1.4 10*3/uL — ABNORMAL HIGH (ref 0.1–1.0)
Monocytes Relative: 13 %
Neutro Abs: 6.6 10*3/uL (ref 1.7–7.7)
Neutrophils Relative %: 61 %
Platelets: 391 10*3/uL (ref 150–400)
RBC: 4.67 MIL/uL (ref 4.22–5.81)
RDW: 11.9 % (ref 11.5–15.5)
WBC: 10.7 10*3/uL — ABNORMAL HIGH (ref 4.0–10.5)
nRBC: 0 % (ref 0.0–0.2)

## 2019-06-07 LAB — POC SARS CORONAVIRUS 2 AG -  ED: SARS Coronavirus 2 Ag: NEGATIVE

## 2019-06-07 LAB — D-DIMER, QUANTITATIVE: D-Dimer, Quant: <0.27 ug/mL-FEU (ref 0.00–0.50)

## 2019-06-07 MED ORDER — SODIUM CHLORIDE (PF) 0.9 % IJ SOLN
INTRAMUSCULAR | Status: AC
  Administered 2019-06-08: 01:00:00
  Filled 2019-06-07: qty 50

## 2019-06-07 MED ORDER — IOHEXOL 350 MG/ML SOLN
100.0000 mL | Freq: Once | INTRAVENOUS | Status: AC | PRN
  Administered 2019-06-07: 100 mL via INTRAVENOUS

## 2019-06-07 MED ORDER — DEXAMETHASONE SODIUM PHOSPHATE 10 MG/ML IJ SOLN
10.0000 mg | Freq: Once | INTRAMUSCULAR | Status: AC
  Administered 2019-06-07: 10 mg via INTRAVENOUS
  Filled 2019-06-07: qty 1

## 2019-06-07 MED ORDER — ALBUTEROL (5 MG/ML) CONTINUOUS INHALATION SOLN
10.0000 mg/h | INHALATION_SOLUTION | RESPIRATORY_TRACT | Status: DC
  Administered 2019-06-07: 10 mg/h via RESPIRATORY_TRACT
  Filled 2019-06-07: qty 20

## 2019-06-07 MED ORDER — ALBUTEROL SULFATE (2.5 MG/3ML) 0.083% IN NEBU
5.0000 mg | INHALATION_SOLUTION | Freq: Once | RESPIRATORY_TRACT | Status: AC
  Administered 2019-06-08: 5 mg via RESPIRATORY_TRACT
  Filled 2019-06-07: qty 6

## 2019-06-07 MED ORDER — IPRATROPIUM BROMIDE 0.02 % IN SOLN
0.5000 mg | Freq: Once | RESPIRATORY_TRACT | Status: AC
  Administered 2019-06-08: 0.5 mg via RESPIRATORY_TRACT
  Filled 2019-06-07: qty 2.5

## 2019-06-07 NOTE — ED Provider Notes (Signed)
[facility] DEPT Provider Note   CSN: [id number] Arrival date & time: 06/07/19  2056     History Chief Complaint  Patient presents with  . Shortness of Breath    Thomas Kramer is a 48 y.o. male.  HPI    Patient presents with shortness of breath.  States that last week he had upper respiratory infection.  Had a negative Covid testing.  States his symptoms resolved.  At midnight he woke with shortness of breath.  Has had cough with clear sputum.  Progressive shortness of breath throughout the day.  EMS arrived patient was given epinephrine, Solu-Medrol and albuterol neb in route.  Patient denies any chest pain.  No new lower extremity swelling or pain.  No recent extended travel or immobilization. Past Medical History:  Diagnosis Date  . Hypertension     Patient Active Problem List   Diagnosis Date Noted  . Foot arch pain 07/04/2011  . HTN (hypertension) 07/04/2011  . 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 HERNIA REPAIR         Family History  Problem Relation Age of Onset  . Heart disease Father     Social History   Tobacco Use  . Smoking status: Never Smoker  Substance Use Topics  . Alcohol use: Not on file  . Drug use: Not on file    Home Medications Prior to Admission medications   Medication Sig Start Date End Date Taking? Authorizing Provider  BEPREVE 1.5 % SOLN  07/27/14   [provider]  labetalol (NORMODYNE) 200 MG tablet Take 200 mg by mouth 2 (two) times daily.      [provider]  losartan-hydrochlorothiazide (HYZAAR) 50-12.5 MG per tablet Take 1 tablet by mouth daily.      [provider]    Allergies    Patient has no known allergies.  Review of Systems   Review of Systems  Constitutional: Negative for chills.  HENT: Negative for sore throat and trouble swallowing.   Eyes: Negative for visual disturbance.  Respiratory:  Positive for cough, shortness of breath and wheezing.   Cardiovascular: Negative for chest pain, palpitations and leg swelling.  Gastrointestinal: Negative for abdominal pain, constipation, diarrhea, nausea and vomiting.  Genitourinary: Negative for dysuria and flank pain.  Musculoskeletal: Negative for back pain, myalgias and neck pain.  Skin: Negative for rash.  Neurological: Negative for dizziness, syncope, weakness, light-headedness, numbness and headaches.  All other systems reviewed and are negative.   Physical Exam Updated Vital Signs There were no vitals taken for this visit.  Physical Exam Vitals and nursing note reviewed.  Constitutional:      Appearance: He is well-developed.  HENT:     Head: Normocephalic and atraumatic.     Nose: Nose normal.     Mouth/Throat:     Mouth: Mucous membranes are moist.  Eyes:     Pupils: Pupils are equal, round, and reactive to light.  Cardiovascular:     Rate and Rhythm: Normal rate and regular rhythm.     Heart sounds: No murmur. No friction rub. No gallop.   Pulmonary:     Breath sounds: Wheezing present.     Comments: Increased work of breathing.  Speaking in full sentences.  Both inspiratory and expiratory wheezing with diminished breath sounds throughout. Abdominal:     General: Bowel sounds are normal.     Palpations: Abdomen is soft.  Tenderness: There is no abdominal tenderness. There is no guarding or rebound.  Musculoskeletal:        General: No swelling, tenderness, deformity or signs of injury. Normal range of motion.     Cervical back: Normal range of motion and neck supple.     Right lower leg: No edema.     Left lower leg: No edema.  Skin:    General: Skin is warm and dry.     Capillary Refill: Capillary refill takes less than 2 seconds.     Coloration: Skin is not jaundiced.     Findings: No bruising, erythema, lesion or rash.  Neurological:     General: No focal deficit present.     Mental Status: He is  alert and oriented to person, place, and time.  Psychiatric:        Behavior: Behavior normal.     ED Results / Procedures / Treatments   Labs (all labs ordered are listed, but only abnormal results are displayed) Labs Reviewed  CBC WITH DIFFERENTIAL/PLATELET  COMPREHENSIVE METABOLIC PANEL  POC SARS CORONAVIRUS 2 AG -  ED    EKG EKG Interpretation  Date/Time:  Saturday June 07 2019 21:24:05 EST Ventricular Rate:  103 PR Interval:    QRS Duration: 89 QT Interval:  331 QTC Calculation: 425 R Axis:   34 Text Interpretation: Sinus tachycardia Probable left atrial enlargement Baseline wander in lead(s) V1 V2 V3 V4 Confirmed by Julianne Rice [phone]) on 06/07/2019 9:25:31 PM   Radiology No results found.  Procedures Procedures (including critical care time)  Medications Ordered in ED Medications  albuterol (PROVENTIL,VENTOLIN) solution continuous neb (has no administration in time range)    ED Course  I have reviewed the triage vital signs and the nursing notes.  Pertinent labs & imaging results that were available during my care of the patient were reviewed by me and considered in my medical decision making (see chart for details).    MDM Rules/Calculators/A&P     CHA2DS2/VAS Stroke Risk Points      N/A >= 2 Points: High Risk  1 - 1.99 Points: Medium Risk  0 Points: Low Risk    A final score could not be computed because of missing components.: Last  Change: N/A     This score determines the patient's risk of having a stroke if the  patient has atrial fibrillation.      This score is not applicable to this patient. Components are not  calculated.                   Will start on continuous breathing treatment.  The patient's symptoms worsen will need to be placed on BiPAP.  Signed out to oncoming emergency provider pending reevaluation and likely admission Final Clinical Impression(s) / ED Diagnoses Final diagnoses:  None    Rx / DC Orders ED  Discharge Orders    None       Julianne Rice, MD 06/07/19 2125

## 2019-06-07 NOTE — ED Triage Notes (Signed)
Patient is complaining of sob brought in by [facility] from home. Patient reports sudden sob. Patient had same symptoms a week ago and tested for covid. Covid was negative. Patient completed phone appt with PCP and was prescribed albuterol inhaler. Pt stated that he had sudden onset of SOB again today and attempted to go to lie down for bed and was unable to lie flat. Pt administered 5mg  of inhaler on EMS arrival with minimal improvement. NO fever, no NVD, no body aches  208/140 HR 110 RR 30 SPO2 98 NRB 15L  18g LEft hand 125 SoluMedrol 0.3mg  Epi IM 10mg  albuterol inhaler- personal

## 2019-06-07 NOTE — ED Provider Notes (Signed)
Respiratory illness last week - neg COVID Tonight acute SOB, NP cough, no history of asthma Wheezing tonight Likely bronchospasm  10:30 - Introduction and re-evaluation: Patient states he feels much better, however, O2 sat is 90% while on 2L O2. No wheezing at present. D-dimer pending, but feel CT chest is warranted for further evaluation.   11:15 - re-eval: he has significant wheezing throughout again. Feels SOB. On 4 L with O2 sat 90%. Additional nebulized tx ordered with Albuterol and Atrovent. Decadron also provided. Dr. Tyrone Nine made aware.  12:00 - Still waiting on second nebulized treatment. O2 sat 89-90%. Blood pressure elevated again to 173/140. Will give SL Nitro and re-evaluate. CTA negative for acute finding.   12:30 - Patient is receiving neb tx now. Discussed admission with Dr. Posey Pronto who accepts the patient on to his service. Patient and wife updated - all questions answered.   CRITICAL CARE Performed by: Dewaine Oats   Total critical care time: 120 minutes  Critical care time was exclusive of separately billable procedures and treating other patients.  Critical care was necessary to treat or prevent imminent or life-threatening deterioration.  Critical care was time spent personally by me on the following activities: development of treatment plan with patient and/or surrogate as well as nursing, discussions with consultants, evaluation of patient's response to treatment, examination of patient, obtaining history from patient or surrogate, ordering and performing treatments and interventions, ordering and review of laboratory studies, ordering and review of radiographic studies, pulse oximetry and re-evaluation of patient's condition.    Charlann Lange, PA-C 06/08/19 Jeral Pinch, MD 06/08/19 [phone]

## 2019-06-08 ENCOUNTER — Encounter (HOSPITAL_COMMUNITY): Payer: Self-pay | Admitting: Internal Medicine

## 2019-06-08 DIAGNOSIS — J9601 Acute respiratory failure with hypoxia: Secondary | ICD-10-CM | POA: Diagnosis present

## 2019-06-08 DIAGNOSIS — I10 Essential (primary) hypertension: Secondary | ICD-10-CM

## 2019-06-08 LAB — BASIC METABOLIC PANEL
Anion gap: 14 (ref 5–15)
BUN: 8 mg/dL (ref 6–20)
CO2: 23 mmol/L (ref 22–32)
Calcium: 9.6 mg/dL (ref 8.9–10.3)
Chloride: 99 mmol/L (ref 98–111)
Creatinine, Ser: 0.73 mg/dL (ref 0.61–1.24)
GFR calc Af Amer: >60 mL/min (ref >60)
GFR calc non Af Amer: >60 mL/min (ref >60)
Glucose, Bld: 173 mg/dL — ABNORMAL HIGH (ref 70–99)
Potassium: 4 mmol/L (ref 3.5–5.1)
Sodium: 136 mmol/L (ref 135–145)

## 2019-06-08 LAB — CBC
HCT: 46 % (ref 39.0–52.0)
Hemoglobin: 15.6 g/dL (ref 13.0–17.0)
MCH: 31.3 pg (ref 26.0–34.0)
MCHC: 33.9 g/dL (ref 30.0–36.0)
MCV: 92.2 fL (ref 80.0–100.0)
Platelets: 372 10*3/uL (ref 150–400)
RBC: 4.99 MIL/uL (ref 4.22–5.81)
RDW: 11.9 % (ref 11.5–15.5)
WBC: 10.3 10*3/uL (ref 4.0–10.5)
nRBC: 0 % (ref 0.0–0.2)

## 2019-06-08 LAB — RESPIRATORY PANEL BY RT PCR (FLU A&B, COVID)
Influenza A by PCR: NEGATIVE
Influenza B by PCR: NEGATIVE
SARS Coronavirus 2 by RT PCR: NEGATIVE

## 2019-06-08 LAB — HIV ANTIBODY (ROUTINE TESTING W REFLEX): HIV Screen 4th Generation wRfx: NONREACTIVE

## 2019-06-08 LAB — TROPONIN I (HIGH SENSITIVITY)
Troponin I (High Sensitivity): 3 ng/L (ref <18)
Troponin I (High Sensitivity): 3 ng/L (ref <18)

## 2019-06-08 LAB — BRAIN NATRIURETIC PEPTIDE: B Natriuretic Peptide: 45.1 pg/mL (ref 0.0–100.0)

## 2019-06-08 MED ORDER — LOSARTAN POTASSIUM-HCTZ 50-12.5 MG PO TABS: 1.0000 | ORAL_TABLET | Freq: Every day | ORAL | Status: DC

## 2019-06-08 MED ORDER — ACETAMINOPHEN 325 MG PO TABS: 650.0000 mg | ORAL_TABLET | Freq: Four times a day (QID) | ORAL | Status: DC | PRN

## 2019-06-08 MED ORDER — BUDESONIDE 0.5 MG/2ML IN SUSP
0.5000 mg | Freq: Two times a day (BID) | RESPIRATORY_TRACT | Status: DC
  Administered 2019-06-08 – 2019-06-09 (×2): 0.5 mg via RESPIRATORY_TRACT
  Filled 2019-06-08 (×2): qty 2

## 2019-06-08 MED ORDER — LABETALOL HCL 200 MG PO TABS: 200.0000 mg | ORAL_TABLET | Freq: Two times a day (BID) | ORAL | Status: DC

## 2019-06-08 MED ORDER — HYDROCHLOROTHIAZIDE 12.5 MG PO CAPS
12.5000 mg | ORAL_CAPSULE | Freq: Every day | ORAL | Status: DC
  Administered 2019-06-08 – 2019-06-09 (×2): 12.5 mg via ORAL
  Filled 2019-06-08 (×2): qty 1

## 2019-06-08 MED ORDER — ENOXAPARIN SODIUM 40 MG/0.4ML ~~LOC~~ SOLN
40.0000 mg | Freq: Every day | SUBCUTANEOUS | Status: DC
  Administered 2019-06-08: 40 mg via SUBCUTANEOUS
  Filled 2019-06-08 (×2): qty 0.4

## 2019-06-08 MED ORDER — LABETALOL HCL 200 MG PO TABS
200.0000 mg | ORAL_TABLET | Freq: Two times a day (BID) | ORAL | Status: DC
  Administered 2019-06-08 – 2019-06-09 (×3): 200 mg via ORAL
  Filled 2019-06-08 (×6): qty 1

## 2019-06-08 MED ORDER — ACETAMINOPHEN 650 MG RE SUPP: 650.0000 mg | Freq: Four times a day (QID) | RECTAL | Status: DC | PRN

## 2019-06-08 MED ORDER — PANTOPRAZOLE SODIUM 40 MG PO TBEC
40.0000 mg | DELAYED_RELEASE_TABLET | Freq: Every day | ORAL | Status: DC
  Administered 2019-06-08 – 2019-06-09 (×2): 40 mg via ORAL
  Filled 2019-06-08 (×2): qty 1

## 2019-06-08 MED ORDER — SODIUM CHLORIDE 0.9 % IV SOLN
500.0000 mg | INTRAVENOUS | Status: DC
  Administered 2019-06-08: 500 mg via INTRAVENOUS
  Filled 2019-06-08 (×2): qty 500

## 2019-06-08 MED ORDER — METHYLPREDNISOLONE SODIUM SUCC 40 MG IJ SOLR
40.0000 mg | Freq: Two times a day (BID) | INTRAMUSCULAR | Status: DC
  Administered 2019-06-08 – 2019-06-09 (×3): 40 mg via INTRAVENOUS
  Filled 2019-06-08 (×3): qty 1

## 2019-06-08 MED ORDER — FLUTICASONE PROPIONATE 50 MCG/ACT NA SUSP
2.0000 | Freq: Every day | NASAL | Status: DC
  Administered 2019-06-09: 2 via NASAL
  Filled 2019-06-08 (×2): qty 16

## 2019-06-08 MED ORDER — IPRATROPIUM-ALBUTEROL 0.5-2.5 (3) MG/3ML IN SOLN
3.0000 mL | Freq: Three times a day (TID) | RESPIRATORY_TRACT | Status: DC
  Administered 2019-06-08 – 2019-06-09 (×3): 3 mL via RESPIRATORY_TRACT
  Filled 2019-06-08 (×3): qty 3

## 2019-06-08 MED ORDER — LOSARTAN POTASSIUM 50 MG PO TABS
50.0000 mg | ORAL_TABLET | Freq: Every day | ORAL | Status: DC
  Administered 2019-06-09: 50 mg via ORAL
  Filled 2019-06-08 (×2): qty 1

## 2019-06-08 MED ORDER — LORATADINE 10 MG PO TABS
10.0000 mg | ORAL_TABLET | Freq: Every day | ORAL | Status: DC
  Administered 2019-06-08 – 2019-06-09 (×2): 10 mg via ORAL
  Filled 2019-06-08 (×2): qty 1

## 2019-06-08 MED ORDER — ALBUTEROL SULFATE (2.5 MG/3ML) 0.083% IN NEBU: 2.5000 mg | INHALATION_SOLUTION | RESPIRATORY_TRACT | Status: DC | PRN

## 2019-06-08 MED ORDER — NITROGLYCERIN 0.4 MG SL SUBL
0.4000 mg | SUBLINGUAL_TABLET | Freq: Once | SUBLINGUAL | Status: AC
  Administered 2019-06-08: 0.4 mg via SUBLINGUAL
  Filled 2019-06-08: qty 1

## 2019-06-08 MED ORDER — IPRATROPIUM-ALBUTEROL 0.5-2.5 (3) MG/3ML IN SOLN
3.0000 mL | Freq: Four times a day (QID) | RESPIRATORY_TRACT | Status: DC
  Administered 2019-06-08 (×3): 3 mL via RESPIRATORY_TRACT
  Filled 2019-06-08 (×2): qty 3

## 2019-06-08 NOTE — H&P (Signed)
History and Physical    Thomas Kramer [id number] DOB: Jan 02, 1971 DOA: 06/07/2019  PCP: Shon Baton, MD  Patient coming from: Home  I have personally briefly reviewed patient's old medical records in [place]  Chief Complaint: Shortness of breath  HPI: Thomas Kramer is a 48 y.o. male with medical history significant for hypertension who presents to the ED for evaluation of shortness of breath.  Patient states he was in his usual state of health until 1.5 weeks ago when he developed mild URI symptoms and fever.  He had shortness of breath with cough productive of clear sputum.  He also had mild runny nose/congestion.  He had a SARS-CoV-2 NAA test performed on 05/29/2019 which was negative.  His PCP prescribed him an albuterol inhaler which he used with relief.  He had been feeling fine intermittently until last night when he woke from sleep with shortness of breath.  He again has cough with clear sputum but this time denies any fever or rhinorrhea/nasal congestion.  He states his shortness of breath is worse when laying flat and improves when sitting up or standing.    He has not had any chills, diaphoresis, chest pain, nausea, vomiting, abdominal pain, dysuria, rash/skin changes, or swelling in his extremities.  He has no history of lung disease including asthma or COPD.  He is a never smoker.  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  He denies any sick contacts.  Due to his persistent symptoms EMS were called.  Per ED documentation patient was noted to have elevated BP of 208/140, RR 30.  He was administered 5 mg inhaler on arrival and subsequently placed on nonrebreather.  He was given 125 mg Solu-Medrol and 0.3 mg IM epinephrine.  Patient states his only medications are losartan-HCTZ and labetalol for blood pressure.  ED Course:  Initial vitals showed BP 141/68, pulse 99, RR 23, temp 98.0  Fahrenheit, SPO2 90% on nonrebreather 15 L O2.  Patient able to be weaned down to nasal cannula with SPO2 90% on 4 L.  Labs notable for WBC 10.7, hemoglobin 14.8, platelets 391,000, sodium 133, potassium 3.8, bicarb 25, BUN 6, creatinine 0.66, serum glucose 179, D-dimer <0.27.  [place] 2 antigen test is negative.  Respiratory panel PCR swab was collected and pending.  Portable chest x-ray was without focal consolidation, edema, or effusion.  CTA chest PE study with contrast was negative for PE.  Mild dilatation of the ascending aorta to 4.1 cm is noted.  Patient was given albuterol and Atrovent nebulizer treatments with transient improvement.  Also given IV Decadron 10 mg once and sublingual nitroglycerin.  Patient subsequently placed on continuous albuterol nebulizer treatment.  The hospitalist service was consulted to admit for further evaluation management of acute respiratory failure with hypoxia.  Review of Systems: All systems reviewed and are negative except as documented in history of present illness above.   Past Medical History:  Diagnosis Date  . Hypertension     Past Surgical History:  Procedure Laterality Date  . HERNIA REPAIR      Social History:  reports that he has never smoked. He does not have any smokeless tobacco history on file. No history on file for alcohol and drug.  No Known Allergies  Family History  Problem Relation Age of Onset  . Heart disease Father      Prior to Admission medications  Medication Sig Start Date End Date Taking? Authorizing Provider  BEPREVE 1.5 % SOLN  07/27/14   [provider]  labetalol (NORMODYNE) 200 MG tablet Take 200 mg by mouth 2 (two) times daily.      [provider]  losartan-hydrochlorothiazide (HYZAAR) 50-12.5 MG per tablet Take 1 tablet by mouth daily.      [provider]    Physical Exam: Vitals:   06/08/19 0030 06/08/19 0046 06/08/19 0059 06/08/19 0100  BP: 139/89  139/89 (!)  143/83  Pulse: (!) 109 97 (!) 101 (!) 107  Resp: 16 15 (!) 23 18  Temp: 98.7 F (37.1 C)     TempSrc:      SpO2: (!) 89% 94% 95% 97%  Weight:      Height:        Constitutional: NAD, calm, comfortable, wearing high flow nasal cannula Eyes: PERRL, lids and conjunctivae normal ENMT: Mucous membranes are moist. Posterior pharynx clear of any exudate or lesions.Normal dentition.  Neck: normal, supple, no masses. Respiratory: Inspiratory wheezing bilaterally.  Normal respiratory effort. No accessory muscle use.  Cardiovascular: Regular rate and rhythm, no murmurs / rubs / gallops. No extremity edema. 2+ pedal pulses. Abdomen: no tenderness, no masses palpated. No hepatosplenomegaly. Bowel sounds positive.  Musculoskeletal: no clubbing / cyanosis. No joint deformity upper and lower extremities. Good ROM, no contractures. Normal muscle tone.  Skin: no rashes, lesions, ulcers. No induration Neurologic: CN 2-12 grossly intact. Sensation intact, Strength 5/5 in all 4.  Psychiatric: Normal judgment and insight. Alert and oriented x 3. Normal mood.     Labs on Admission: I have personally reviewed following labs and imaging studies  CBC: Recent Labs  Lab 06/07/19 2129  WBC 10.7*  NEUTROABS 6.6  HGB 14.8  HCT 43.7  MCV 93.6  PLT 0000000   Basic Metabolic Panel: Recent Labs  Lab 06/07/19 2129  NA 133*  K 3.8  CL 98  CO2 25  GLUCOSE 179*  BUN 6  CREATININE 0.66  CALCIUM 8.6*   GFR: Estimated Creatinine Clearance: 116.6 mL/min (by C-G formula based on SCr of 0.66 mg/dL). Liver Function Tests: Recent Labs  Lab 06/07/19 2129  AST 19  ALT 21  ALKPHOS 39  BILITOT 0.6  PROT 7.4  ALBUMIN 4.3   No results for input(s): LIPASE, AMYLASE in the last 168 hours. No results for input(s): AMMONIA in the last 168 hours. Coagulation Profile: No results for input(s): INR, PROTIME in the last 168 hours. Cardiac Enzymes: No results for input(s): CKTOTAL, CKMB, CKMBINDEX, TROPONINI in  the last 168 hours. BNP (last 3 results) No results for input(s): PROBNP in the last 8760 hours. HbA1C: No results for input(s): HGBA1C in the last 72 hours. CBG: No results for input(s): GLUCAP in the last 168 hours. Lipid Profile: No results for input(s): CHOL, HDL, LDLCALC, TRIG, CHOLHDL, LDLDIRECT in the last 72 hours. Thyroid Function Tests: No results for input(s): TSH, T4TOTAL, FREET4, T3FREE, THYROIDAB in the last 72 hours. Anemia Panel: No results for input(s): VITAMINB12, FOLATE, FERRITIN, TIBC, IRON, RETICCTPCT in the last 72 hours. Urine analysis: No results found for: COLORURINE, APPEARANCEUR, LABSPEC, PHURINE, GLUCOSEU, HGBUR, BILIRUBINUR, KETONESUR, PROTEINUR, UROBILINOGEN, NITRITE, LEUKOCYTESUR  Radiological Exams on Admission: CT Angio Chest PE W and/or Wo Contrast  Result Date: 06/08/2019 CLINICAL DATA:  Shortness of breath EXAM: CT ANGIOGRAPHY CHEST WITH CONTRAST TECHNIQUE: Multidetector CT imaging of the chest was performed using the standard protocol during bolus administration of intravenous contrast. Multiplanar CT image  reconstructions and MIPs were obtained to evaluate the vascular anatomy. CONTRAST:  7mL OMNIPAQUE IOHEXOL 350 MG/ML SOLN COMPARISON:  Plain film from earlier in the same day. FINDINGS: Cardiovascular: Thoracic aorta demonstrates mild atherosclerotic calcification. The ascending aorta is mildly dilated at 4.1 cm. No evidence of dissection is noted. No cardiac enlargement is seen. The pulmonary artery shows a normal branching pattern. No definitive filling defects are seen to suggest pulmonary emboli. Scattered coronary calcifications are noted. Mediastinum/Nodes: Thoracic inlet is within normal limits. No hilar or mediastinal adenopathy is noted. The esophagus is unremarkable. Lungs/Pleura: The lungs are well aerated bilaterally. No focal infiltrate or sizable effusion is seen. No bony abnormality is noted. Small subpleural lymph node is noted along the  major fissure in the left lung best seen on image number 77 of series 10. Upper Abdomen: Visualized upper abdomen shows no acute abnormality. Musculoskeletal: No acute bony abnormality is noted. Review of the MIP images confirms the above findings. IMPRESSION: No evidence of pulmonary emboli. Mild dilatation of the ascending aorta to 4.1 cm. Recommend annual imaging followup by CTA or MRA. This recommendation follows 2010 ACCF/AHA/AATS/ACR/ASA/SCA/SCAI/SIR/STS/SVM Guidelines for the Diagnosis and Management of Patients with Thoracic Aortic Disease. Circulation. 2010; [id number]. Aortic aneurysm NOS (ICD10-I71.9) No other focal abnormality is noted. Aortic Atherosclerosis (ICD10-I70.0). Aortic aneurysm NOS (ICD10-I71.9). Electronically Signed   By: Inez Catalina M.D.   On: 06/08/2019 00:01   DG Chest Port 1 View  Result Date: 06/07/2019 CLINICAL DATA:  Shortness of breath, negative COVID-19 test EXAM: PORTABLE CHEST 1 VIEW COMPARISON:  None. FINDINGS: The heart size and mediastinal contours are within normal limits. Both lungs are clear. The visualized skeletal structures are unremarkable. IMPRESSION: No active disease. Electronically Signed   By: Inez Catalina M.D.   On: 06/07/2019 21:34    EKG: Independently reviewed. Sinus tachycardia, wandering leads, no acute ischemic changes.  No prior for comparison.  Assessment/Plan Principal Problem:   Acute respiratory failure with hypoxia (HCC) Active Problems:   HTN (hypertension)  Thomas Kramer is a 48 y.o. male with medical history significant for hypertension who is admitted with acute respiratory failure with hypoxia.  Acute respiratory failure with hypoxia: Patient with new onset hypoxia and inspiratory wheezing.  Unclear etiology, no prior history of the same or underlying known lung conditions.  Possibly bronchiolitis as sequela of recent viral respiratory illness.  SARS-CoV-2 antigen test is negative as well as SARS-CoV-2 and influenza A/B  PCR tests.  Chest x-ray and CTA PE study are without evidence of pneumonia.  He does report orthopnea without evidence of congestive heart failure.  No evidence of anaphylactic reaction.  Possibly reactive airway disease due to environmental exposure at work. -Continue supplemental oxygen as needed, wean off as able -Continue scheduled duo nebs, as needed albuterol inhaler -Continue IV Solu-Medrol 40 mg twice daily -Will need PFTs once acute illness is improved  Hypertension: Initially hypertensive, now improved.  Will resume home losartan-HCTZ.  Will hold home beta-blocker for now.  4.1 cm ascending aorta dilatation seen on CT imaging: Annual imaging follow-up recommended.  DVT prophylaxis: Lovenox Code Status: Full code, confirmed with patient Family Communication: Discussed with wife at bedside Disposition Plan: Likely discharge to home in 1-2 days Consults called: None Admission status: Observation   Zada Finders MD [facility]  If 7PM-7AM, please contact night-coverage [email or web address]  06/08/2019, 1:32 AM

## 2019-06-08 NOTE — Progress Notes (Signed)
Charge RN attempted to get report. Unable to get in touch with the nurse. Will call back in a timely manner.

## 2019-06-08 NOTE — ED Notes (Signed)
RN has called RT regarding patient needing DUONEB. RT will be down to provide DUONEB ASAP.

## 2019-06-08 NOTE — ED Notes (Signed)
RN Lattie Haw was providing patient care and was unable to take report. RN Lattie Haw will call once patient care is completed to receive report.

## 2019-06-08 NOTE — Progress Notes (Signed)
PROGRESS NOTE    Thomas Kramer  [id number] DOB: 31-Oct-1970 DOA: 06/07/2019 PCP: Shon Baton, MD    Brief Narrative:  HPI per Dr. Kathi Ludwig Thomas Kramer is a 48 y.o. male with medical history significant for hypertension who presents to the ED for evaluation of shortness of breath.  Patient states he was in his usual state of health until 1.5 weeks ago when he developed mild URI symptoms and fever.  He had shortness of breath with cough productive of clear sputum.  He also had mild runny nose/congestion.  He had a SARS-CoV-2 NAA test performed on 05/29/2019 which was negative.  His PCP prescribed him an albuterol inhaler which he used with relief.  He had been feeling fine intermittently until last night when he woke from sleep with shortness of breath.  He again has cough with clear sputum but this time denies any fever or rhinorrhea/nasal congestion.  He states his shortness of breath is worse when laying flat and improves when sitting up or standing.    He has not had any chills, diaphoresis, chest pain, nausea, vomiting, abdominal pain, dysuria, rash/skin changes, or swelling in his extremities.  He has no history of lung disease including asthma or COPD.  He is a never smoker.  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  He denies any sick contacts.  Due to his persistent symptoms EMS were called.  Per ED documentation patient was noted to have elevated BP of 208/140, RR 30.  He was administered 5 mg inhaler on arrival and subsequently placed on nonrebreather.  He was given 125 mg Solu-Medrol and 0.3 mg IM epinephrine.  Patient states his only medications are losartan-HCTZ and labetalol for blood pressure.  ED Course:  Initial vitals showed BP 141/68, pulse 99, RR 23, temp 98.0 Fahrenheit, SPO2 90% on nonrebreather 15 L O2.  Patient able to be weaned down to nasal cannula with SPO2 90% on 4 L.  Labs  notable for WBC 10.7, hemoglobin 14.8, platelets 391,000, sodium 133, potassium 3.8, bicarb 25, BUN 6, creatinine 0.66, serum glucose 179, D-dimer <0.27.  [place] 2 antigen test is negative.  Respiratory panel PCR swab was collected and pending.  Portable chest x-ray was without focal consolidation, edema, or effusion.  CTA chest PE study with contrast was negative for PE.  Mild dilatation of the ascending aorta to 4.1 cm is noted.  Patient was given albuterol and Atrovent nebulizer treatments with transient improvement.  Also given IV Decadron 10 mg once and sublingual nitroglycerin.  Patient subsequently placed on continuous albuterol nebulizer treatment.  The hospitalist service was consulted to admit for further evaluation management of acute respiratory failure with hypoxia.  Assessment & Plan:   Principal Problem:   Acute respiratory failure with hypoxia (HCC) Active Problems:   HTN (hypertension)  1 acute respiratory failure with hypoxia Likely secondary to reactive airway disease as a sequelae of recent upper viral respiratory illness.  Patient with no prior history of underlying lung issues.  Patient denies any history of COPD.  Patient with no history of asthma.  Patient however does state does have some wheezing after mowing the grass.  SARS-CoV-2 anxious urine negative.  SARS Covid 2 PCR and influenza AB PCR negative.  CT angiogram chest and chest x-ray negative.  There was no evidence of anaphylactic reaction.  Continue oxygen.  Patient was placed on scheduled duo nebs  which we will continue for now.  Continue IV Solu-Medrol.  Placed on Pulmicort, Flonase, Claritin, PPI.  Supportive care.  2.  Hypertension Continue home regimen of Cozaar and HCTZ.  Resume home regimen of labetalol.  Follow.  3.  4.1 cm ascending aortic dilatation Noted on CT.  Repeat imaging in 1 year.   DVT prophylaxis: Lovenox Code Status: Full Family Communication: Updated patient.  No family at  bedside. Disposition Plan: Likely home when clinically improved hopefully in the next 24 to 48 hours.   Consultants:   None  Procedures:   CT angiogram 06/07/2019  Chest x-ray 06/07/2019  Antimicrobials:   IV azithromycin 06/08/2019   Subjective: Patient sitting up on gurney in the ED states he is feeling better than he did on presentation after multiple nebulizer treatments.  Denies any chest pain.  Objective: Vitals:   06/08/19 1130 06/08/19 1200 06/08/19 1230 06/08/19 1329  BP: (!) 161/105 (!) 164/122 (!) 166/106 (!) 158/105  Pulse: (!) 108 (!) 111 96 (!) 112  Resp: 19 18 10 13   Temp:      TempSrc:      SpO2: 93% 92% 91% 98%  Weight:      Height:        Intake/Output Summary (Last 24 hours) at 06/08/2019 1340 Last data filed at 06/08/2019 1244 Gross per 24 hour  Intake 250 ml  Output --  Net 250 ml   Filed Weights   06/07/19 2118  Weight: 80 kg    Examination:  General exam: Appears calm and comfortable  Respiratory system: Fair air movement.  Minimal expiratory wheezing.  No crackles.  Speaking in full sentences.  Normal respiratory effort.   Cardiovascular system: S1 & S2 heard, RRR. No JVD, murmurs, rubs, gallops or clicks. No pedal edema. Gastrointestinal system: Abdomen is nondistended, soft and nontender. No organomegaly or masses felt. Normal bowel sounds heard. Central nervous system: Alert and oriented. No focal neurological deficits. Extremities: Symmetric 5 x 5 power. Skin: No rashes, lesions or ulcers Psychiatry: Judgement and insight appear normal. Mood & affect appropriate.     Data Reviewed: I have personally reviewed following labs and imaging studies  CBC: Recent Labs  Lab 06/07/19 2129 06/08/19 0410  WBC 10.7* 10.3  NEUTROABS 6.6  --   HGB 14.8 15.6  HCT 43.7 46.0  MCV 93.6 92.2  PLT 391 [id number]   Basic Metabolic Panel: Recent Labs  Lab 06/07/19 2129 06/08/19 0410  NA 133* 136  K 3.8 4.0  CL 98 99  CO2 25 23  GLUCOSE  179* 173*  BUN 6 8  CREATININE 0.66 0.73  CALCIUM 8.6* 9.6   GFR: Estimated Creatinine Clearance: 116.6 mL/min (by C-G formula based on SCr of 0.73 mg/dL). Liver Function Tests: Recent Labs  Lab 06/07/19 2129  AST 19  ALT 21  ALKPHOS 39  BILITOT 0.6  PROT 7.4  ALBUMIN 4.3   No results for input(s): LIPASE, AMYLASE in the last 168 hours. No results for input(s): AMMONIA in the last 168 hours. Coagulation Profile: No results for input(s): INR, PROTIME in the last 168 hours. Cardiac Enzymes: No results for input(s): CKTOTAL, CKMB, CKMBINDEX, TROPONINI in the last 168 hours. BNP (last 3 results) No results for input(s): PROBNP in the last 8760 hours. HbA1C: No results for input(s): HGBA1C in the last 72 hours. CBG: No results for input(s): GLUCAP in the last 168 hours. Lipid Profile: No results for input(s): CHOL, HDL, LDLCALC, TRIG, CHOLHDL, LDLDIRECT in the last 72  hours. Thyroid Function Tests: No results for input(s): TSH, T4TOTAL, FREET4, T3FREE, THYROIDAB in the last 72 hours. Anemia Panel: No results for input(s): VITAMINB12, FOLATE, FERRITIN, TIBC, IRON, RETICCTPCT in the last 72 hours. Sepsis Labs: No results for input(s): PROCALCITON, LATICACIDVEN in the last 168 hours.  Recent Results (from the past 240 hour(s))  Respiratory Panel by RT PCR (Flu A&B, Covid) - Nasopharyngeal Swab     Status: None   Collection Time: 06/08/19 12:00 AM   Specimen: Nasopharyngeal Swab  Result Value Ref Range Status   SARS Coronavirus 2 by RT PCR NEGATIVE NEGATIVE Final    Comment: (NOTE) SARS-CoV-2 target nucleic acids are NOT DETECTED. The SARS-CoV-2 RNA is generally detectable in upper respiratoy specimens during the acute phase of infection. The lowest concentration of SARS-CoV-2 viral copies this assay can detect is 131 copies/mL. A negative result does not preclude SARS-Cov-2 infection and should not be used as the sole basis for treatment or other patient management  decisions. A negative result may occur with  improper specimen collection/handling, submission of specimen other than nasopharyngeal swab, presence of viral mutation(s) within the areas targeted by this assay, and inadequate number of viral copies (<131 copies/mL). A negative result must be combined with clinical observations, patient history, and epidemiological information. The expected result is Negative. Fact Sheet for Patients:  PinkCheek.be Fact Sheet for Healthcare Providers:  GravelBags.it This test is not yet ap proved or cleared by the [place] FDA and  has been authorized for detection and/or diagnosis of SARS-CoV-2 by FDA under an Emergency Use Authorization (EUA). This EUA will remain  in effect (meaning this test can be used) for the duration of the COVID-19 declaration under Section 564(b)(1) of the Act, 21 U.S.C. section 360bbb-3(b)(1), unless the authorization is terminated or revoked sooner.    Influenza A by PCR NEGATIVE NEGATIVE Final   Influenza B by PCR NEGATIVE NEGATIVE Final    Comment: (NOTE) The Xpert Xpress SARS-CoV-2/FLU/RSV assay is intended as an aid in  the diagnosis of influenza from Nasopharyngeal swab specimens and  should not be used as a sole basis for treatment. Nasal washings and  aspirates are unacceptable for Xpert Xpress SARS-CoV-2/FLU/RSV  testing. Fact Sheet for Patients: PinkCheek.be Fact Sheet for Healthcare Providers: GravelBags.it This test is not yet approved or cleared by the [place] FDA and  has been authorized for detection and/or diagnosis of SARS-CoV-2 by  FDA under an Emergency Use Authorization (EUA). This EUA will remain  in effect (meaning this test can be used) for the duration of the  Covid-19 declaration under Section 564(b)(1) of the Act, 21  U.S.C. section 360bbb-3(b)(1), unless the authorization  is  terminated or revoked. Performed at [facility], [place] [address] [place], [place] [address]          Radiology Studies: CT Angio Chest PE W and/or Wo Contrast  Result Date: 06/08/2019 CLINICAL DATA:  Shortness of breath EXAM: CT ANGIOGRAPHY CHEST WITH CONTRAST TECHNIQUE: Multidetector CT imaging of the chest was performed using the standard protocol during bolus administration of intravenous contrast. Multiplanar CT image reconstructions and MIPs were obtained to evaluate the vascular anatomy. CONTRAST:  33mL OMNIPAQUE IOHEXOL 350 MG/ML SOLN COMPARISON:  Plain film from earlier in the same day. FINDINGS: Cardiovascular: Thoracic aorta demonstrates mild atherosclerotic calcification. The ascending aorta is mildly dilated at 4.1 cm. No evidence of dissection is noted. No cardiac enlargement is seen. The pulmonary artery shows a normal branching pattern. No definitive filling  defects are seen to suggest pulmonary emboli. Scattered coronary calcifications are noted. Mediastinum/Nodes: Thoracic inlet is within normal limits. No hilar or mediastinal adenopathy is noted. The esophagus is unremarkable. Lungs/Pleura: The lungs are well aerated bilaterally. No focal infiltrate or sizable effusion is seen. No bony abnormality is noted. Small subpleural lymph node is noted along the major fissure in the left lung best seen on image number 77 of series 10. Upper Abdomen: Visualized upper abdomen shows no acute abnormality. Musculoskeletal: No acute bony abnormality is noted. Review of the MIP images confirms the above findings. IMPRESSION: No evidence of pulmonary emboli. Mild dilatation of the ascending aorta to 4.1 cm. Recommend annual imaging followup by CTA or MRA. This recommendation follows 2010 ACCF/AHA/AATS/ACR/ASA/SCA/SCAI/SIR/STS/SVM Guidelines for the Diagnosis and Management of Patients with Thoracic Aortic Disease. Circulation. 2010; [id number]. Aortic aneurysm NOS  (ICD10-I71.9) No other focal abnormality is noted. Aortic Atherosclerosis (ICD10-I70.0). Aortic aneurysm NOS (ICD10-I71.9). Electronically Signed   By: Inez Catalina M.D.   On: 06/08/2019 00:01   DG Chest Port 1 View  Result Date: 06/07/2019 CLINICAL DATA:  Shortness of breath, negative COVID-19 test EXAM: PORTABLE CHEST 1 VIEW COMPARISON:  None. FINDINGS: The heart size and mediastinal contours are within normal limits. Both lungs are clear. The visualized skeletal structures are unremarkable. IMPRESSION: No active disease. Electronically Signed   By: Inez Catalina M.D.   On: 06/07/2019 21:34        Scheduled Meds: . budesonide (PULMICORT) nebulizer solution  0.5 mg Nebulization BID  . enoxaparin (LOVENOX) injection  40 mg Subcutaneous Daily  . fluticasone  2 spray Each Nare Daily  . losartan  50 mg Oral Daily   And  . hydrochlorothiazide  12.5 mg Oral Daily  . ipratropium-albuterol  3 mL Nebulization Q6H  . labetalol  200 mg Oral BID  . loratadine  10 mg Oral Daily  . methylPREDNISolone (SOLU-MEDROL) injection  40 mg Intravenous Q12H  . pantoprazole  40 mg Oral Daily   Continuous Infusions: . azithromycin Stopped (06/08/19 1244)     LOS: 0 days    Time spent: 35 minutes  No charge    Irine Seal, MD [facility]  If 7PM-7AM, please contact night-coverage [email or web address] 06/08/2019, 1:40 PM

## 2019-06-09 DIAGNOSIS — J45909 Unspecified asthma, uncomplicated: Secondary | ICD-10-CM | POA: Diagnosis present

## 2019-06-09 DIAGNOSIS — J9601 Acute respiratory failure with hypoxia: Secondary | ICD-10-CM | POA: Diagnosis not present

## 2019-06-09 DIAGNOSIS — J4520 Mild intermittent asthma, uncomplicated: Secondary | ICD-10-CM

## 2019-06-09 DIAGNOSIS — D72829 Elevated white blood cell count, unspecified: Secondary | ICD-10-CM

## 2019-06-09 DIAGNOSIS — I10 Essential (primary) hypertension: Secondary | ICD-10-CM | POA: Diagnosis not present

## 2019-06-09 LAB — CBC WITH DIFFERENTIAL/PLATELET
Abs Immature Granulocytes: 0.04 10*3/uL (ref 0.00–0.07)
Basophils Absolute: 0 10*3/uL (ref 0.0–0.1)
Basophils Relative: 0 %
Eosinophils Absolute: 0 10*3/uL (ref 0.0–0.5)
Eosinophils Relative: 0 %
HCT: 44.8 % (ref 39.0–52.0)
Hemoglobin: 14.8 g/dL (ref 13.0–17.0)
Immature Granulocytes: 0 %
Lymphocytes Relative: 4 %
Lymphs Abs: 0.6 10*3/uL — ABNORMAL LOW (ref 0.7–4.0)
MCH: 31.1 pg (ref 26.0–34.0)
MCHC: 33 g/dL (ref 30.0–36.0)
MCV: 94.1 fL (ref 80.0–100.0)
Monocytes Absolute: 0.8 10*3/uL (ref 0.1–1.0)
Monocytes Relative: 5 %
Neutro Abs: 14 10*3/uL — ABNORMAL HIGH (ref 1.7–7.7)
Neutrophils Relative %: 91 %
Platelets: 353 10*3/uL (ref 150–400)
RBC: 4.76 MIL/uL (ref 4.22–5.81)
RDW: 12 % (ref 11.5–15.5)
WBC: 15.5 10*3/uL — ABNORMAL HIGH (ref 4.0–10.5)
nRBC: 0 % (ref 0.0–0.2)

## 2019-06-09 LAB — BASIC METABOLIC PANEL
Anion gap: 11 (ref 5–15)
BUN: 18 mg/dL (ref 6–20)
CO2: 24 mmol/L (ref 22–32)
Calcium: 9.3 mg/dL (ref 8.9–10.3)
Chloride: 101 mmol/L (ref 98–111)
Creatinine, Ser: 0.73 mg/dL (ref 0.61–1.24)
GFR calc Af Amer: >60 mL/min (ref >60)
GFR calc non Af Amer: >60 mL/min (ref >60)
Glucose, Bld: 165 mg/dL — ABNORMAL HIGH (ref 70–99)
Potassium: 4.2 mmol/L (ref 3.5–5.1)
Sodium: 136 mmol/L (ref 135–145)

## 2019-06-09 LAB — MAGNESIUM: Magnesium: 2.6 mg/dL — ABNORMAL HIGH (ref 1.7–2.4)

## 2019-06-09 MED ORDER — AZITHROMYCIN 250 MG PO TABS
500.0000 mg | ORAL_TABLET | Freq: Every day | ORAL | Status: DC
  Administered 2019-06-09: 500 mg via ORAL
  Filled 2019-06-09: qty 2

## 2019-06-09 MED ORDER — PREDNISONE 20 MG PO TABS: 20.0000 mg | ORAL_TABLET | Freq: Every day | ORAL | 0 refills | Status: DC

## 2019-06-09 MED ORDER — GUAIFENESIN ER 600 MG PO TB12: 1200.0000 mg | ORAL_TABLET | Freq: Two times a day (BID) | ORAL | 0 refills | Status: AC

## 2019-06-09 MED ORDER — LORATADINE 10 MG PO TABS: 10.0000 mg | ORAL_TABLET | Freq: Every day | ORAL | 0 refills | Status: DC

## 2019-06-09 MED ORDER — PANTOPRAZOLE SODIUM 40 MG PO TBEC: 40.0000 mg | DELAYED_RELEASE_TABLET | Freq: Every day | ORAL | 0 refills | Status: DC

## 2019-06-09 MED ORDER — COMBIVENT RESPIMAT 20-100 MCG/ACT IN AERS: 1.0000 | INHALATION_SPRAY | Freq: Four times a day (QID) | RESPIRATORY_TRACT | 1 refills | Status: DC | PRN

## 2019-06-09 MED ORDER — GUAIFENESIN ER 600 MG PO TB12
1200.0000 mg | ORAL_TABLET | Freq: Two times a day (BID) | ORAL | Status: DC
  Administered 2019-06-09: 1200 mg via ORAL
  Filled 2019-06-09: qty 2

## 2019-06-09 MED ORDER — FLUTICASONE PROPIONATE 50 MCG/ACT NA SUSP: 2.0000 | Freq: Every day | NASAL | 0 refills | Status: DC

## 2019-06-09 MED ORDER — AZITHROMYCIN 500 MG PO TABS: 500.0000 mg | ORAL_TABLET | Freq: Every day | ORAL | 0 refills | Status: AC

## 2019-06-09 NOTE — Discharge Summary (Signed)
Physician Discharge Summary  Thomas Kramer [id number] DOB: [birth year] DOA: 06/07/2019  PCP: Shon Baton, MD  Admit date: 06/07/2019 Discharge date: 06/09/2019  Time spent: 50 minutes  Recommendations for Outpatient Follow-up:  1. Follow-up with Shon Baton, MD in 1 to 2 weeks.  On follow-up patient may need referral for PFTs and repeat CBC done to follow-up on leukocytosis.  Patient also need imaging in 1 year to follow-up on ascending aortic dilatation.   Discharge Diagnoses:  Principal Problem:   Acute respiratory failure with hypoxia ([place]) Active Problems:   Reactive airway disease with wheezing   HTN (hypertension)   Leukocytosis   Discharge Condition: Stable and improved  Diet recommendation: Heart healthy  Filed Weights   06/07/19 2118 06/09/19 0824  Weight: 80 kg 78.4 kg    History of present illness:  Per Dr. Kathi Ludwig Thomas Kramer is a 48 y.o. male with medical history significant for hypertension who presents to the ED for evaluation of shortness of breath.  Patient states he was in his usual state of health until 1.5 weeks ago when he developed mild URI symptoms and fever.  He had shortness of breath with cough productive of clear sputum.  He also had mild runny nose/congestion.  He had a SARS-CoV-2 NAA test performed on 05/29/2019 which was negative.  His PCP prescribed him an albuterol inhaler which he used with relief.  He had been feeling fine intermittently until last night when he woke from sleep with shortness of breath.  He again has cough with clear sputum but this time denies any fever or rhinorrhea/nasal congestion.  He states his shortness of breath is worse when laying flat and improves when sitting up or standing.    He has not had any chills, diaphoresis, chest pain, nausea, vomiting, abdominal pain, dysuria, rash/skin changes, or swelling in his extremities.  He has no history of lung disease including asthma or COPD.  He is a never smoker.  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  He denies any sick contacts.  Due to his persistent symptoms EMS were called.  Per ED documentation patient was noted to have elevated BP of 208/140, RR 30.  He was administered 5 mg inhaler on arrival and subsequently placed on nonrebreather.  He was given 125 mg Solu-Medrol and 0.3 mg IM epinephrine.  Patient states his only medications are losartan-HCTZ and labetalol for blood pressure.  ED Course:  Initial vitals showed BP 141/68, pulse 99, RR 23, temp 98.0 Fahrenheit, SPO2 90% on nonrebreather 15 L O2.  Patient able to be weaned down to nasal cannula with SPO2 90% on 4 L.  Labs notable for WBC 10.7, hemoglobin 14.8, platelets 391,000, sodium 133, potassium 3.8, bicarb 25, BUN 6, creatinine 0.66, serum glucose 179, D-dimer <0.27.  [place] 2 antigen test is negative.  Respiratory panel PCR swab was collected and pending.  Portable chest x-ray was without focal consolidation, edema, or effusion.  CTA chest PE study with contrast was negative for PE.  Mild dilatation of the ascending aorta to 4.1 cm is noted.  Patient was given albuterol and Atrovent nebulizer treatments with transient improvement.  Also given IV Decadron 10 mg once and sublingual nitroglycerin.  Patient subsequently placed on continuous albuterol nebulizer treatment.  The hospitalist service was consulted to admit for further evaluation management of acute respiratory failure with hypoxia.  Hospital Course:  1  acute respiratory failure with hypoxia/probable reactive airway disease Likely secondary to reactive airway disease as a sequelae of recent upper viral respiratory illness.  Patient with no prior history of underlying lung issues.  Patient denied any history of COPD.  Patient with no history of asthma.  Patient however did state does have some wheezing after mowing the grass.  SARS-CoV-2  anxious urine negative.  SARS Covid 2 PCR and influenza AB PCR negative.  CT angiogram chest and chest x-ray negative.  There was no evidence of anaphylactic reaction.    Patient maintained on oxygen.  Patient was placed on scheduled duo nebs, IV Solu-Medrol, Pulmicort, Flonase, Claritin, PPI and azithromycin added to regimen.  Patient improved clinically.  Sats improved.  By day of discharge patient noted to have ambulatory sats ranging from 91 to 93% with no associated shortness of breath or dizziness.  Patient improved clinically.  Wheezing improved.  Patient be discharged home on a steroid taper, Combivent 3 times daily for 5 days and then as needed, Flonase, Claritin, PPI.  Outpatient follow-up with PCP.    2.  Hypertension Patient maintained on home regimen of Cozaar, HCTZ and labetalol.  Outpatient follow-up.  3.  4.1 cm ascending aortic dilatation Noted on CT.  Repeat imaging in 1 year.  4.  Leukocytosis Likely steroid-induced.  On admission patient had a normal white count.  Patient with no symptoms of infection.  Patient with no dysuria.  Patient remained afebrile.  Chest x-ray and CT chest done was negative for any acute infiltrate.  Patient was started on steroids secondary to problem #1.  Outpatient follow-up with PCP.  Procedures:  CT angiogram chest 06/07/2019  Chest x-ray 06/07/2019  Consultations:  None  Discharge Exam: Vitals:   06/09/19 1344 06/09/19 1353  BP: (!) 141/104   Pulse: 92   Resp: 20   Temp: 97.7 F (36.5 C)   SpO2: 95% 96%    General: NAD Cardiovascular: RRR Respiratory: Fair air movement.  No crackles noted.  Minimal expiratory wheezing.   Discharge Instructions   Discharge Instructions    Diet - low sodium heart healthy   Complete by: As directed    Increase activity slowly   Complete by: As directed      Allergies as of 06/09/2019   No Known Allergies     Medication List    TAKE these medications   azithromycin 500 MG  tablet Commonly known as: ZITHROMAX Take 1 tablet (500 mg total) by mouth daily for 3 days. Start taking on: June 10, 2019   Bepreve 1.5 % Soln Generic drug: Bepotastine Besilate   Combivent Respimat 20-100 MCG/ACT Aers respimat Generic drug: Ipratropium-Albuterol Inhale 1 puff into the lungs every 6 (six) hours as needed for wheezing or shortness of breath. Use 3 times daily x 5 days then every 6 hours as needed.   fluticasone 50 MCG/ACT nasal spray Commonly known as: FLONASE Place 2 sprays into both nostrils daily. Start taking on: June 10, 2019   guaiFENesin 600 MG 12 hr tablet Commonly known as: MUCINEX Take 2 tablets (1,200 mg total) by mouth 2 (two) times daily for 4 days.   labetalol 200 MG tablet Commonly known as: NORMODYNE Take 200 mg by mouth 2 (two) times daily.   loratadine 10 MG tablet Commonly known as: CLARITIN Take 1 tablet (10 mg total) by mouth daily. Start taking on: June 10, 2019   losartan-hydrochlorothiazide 50-12.5 MG tablet Commonly known as: HYZAAR Take 1 tablet by mouth daily.  pantoprazole 40 MG tablet Commonly known as: PROTONIX Take 1 tablet (40 mg total) by mouth daily. Start taking on: June 10, 2019   predniSONE 20 MG tablet Commonly known as: DELTASONE Take 1-3 tablets (20-60 mg total) by mouth daily with breakfast. Take 3 tablets (60mg ) daily x 3 days, then 2 tablets (40mg ) daily x 3 days, then 1 tablet (20mg ) daily x 3 days then stop.      No Known Allergies Follow-up Information    Shon Baton, MD. Schedule an appointment as soon as possible for a visit in 2 week(s).   Specialty: Internal Medicine Why: f/u in 1-2 weeks Contact information: [address] [place] [address] [phone]            The results of significant diagnostics from this hospitalization (including imaging, microbiology, ancillary and laboratory) are listed below for reference.    Significant Diagnostic Studies: CT Angio  Chest PE W and/or Wo Contrast  Result Date: 06/08/2019 CLINICAL DATA:  Shortness of breath EXAM: CT ANGIOGRAPHY CHEST WITH CONTRAST TECHNIQUE: Multidetector CT imaging of the chest was performed using the standard protocol during bolus administration of intravenous contrast. Multiplanar CT image reconstructions and MIPs were obtained to evaluate the vascular anatomy. CONTRAST:  25mL OMNIPAQUE IOHEXOL 350 MG/ML SOLN COMPARISON:  Plain film from earlier in the same day. FINDINGS: Cardiovascular: Thoracic aorta demonstrates mild atherosclerotic calcification. The ascending aorta is mildly dilated at 4.1 cm. No evidence of dissection is noted. No cardiac enlargement is seen. The pulmonary artery shows a normal branching pattern. No definitive filling defects are seen to suggest pulmonary emboli. Scattered coronary calcifications are noted. Mediastinum/Nodes: Thoracic inlet is within normal limits. No hilar or mediastinal adenopathy is noted. The esophagus is unremarkable. Lungs/Pleura: The lungs are well aerated bilaterally. No focal infiltrate or sizable effusion is seen. No bony abnormality is noted. Small subpleural lymph node is noted along the major fissure in the left lung best seen on image number 77 of series 10. Upper Abdomen: Visualized upper abdomen shows no acute abnormality. Musculoskeletal: No acute bony abnormality is noted. Review of the MIP images confirms the above findings. IMPRESSION: No evidence of pulmonary emboli. Mild dilatation of the ascending aorta to 4.1 cm. Recommend annual imaging followup by CTA or MRA. This recommendation follows 2010 ACCF/AHA/AATS/ACR/ASA/SCA/SCAI/SIR/STS/SVM Guidelines for the Diagnosis and Management of Patients with Thoracic Aortic Disease. Circulation. 2010; [id number]. Aortic aneurysm NOS (ICD10-I71.9) No other focal abnormality is noted. Aortic Atherosclerosis (ICD10-I70.0). Aortic aneurysm NOS (ICD10-I71.9). Electronically Signed   By: Inez Catalina M.D.    On: 06/08/2019 00:01   DG Chest Port 1 View  Result Date: 06/07/2019 CLINICAL DATA:  Shortness of breath, negative COVID-19 test EXAM: PORTABLE CHEST 1 VIEW COMPARISON:  None. FINDINGS: The heart size and mediastinal contours are within normal limits. Both lungs are clear. The visualized skeletal structures are unremarkable. IMPRESSION: No active disease. Electronically Signed   By: Inez Catalina M.D.   On: 06/07/2019 21:34    Microbiology: Recent Results (from the past 240 hour(s))  Respiratory Panel by RT PCR (Flu A&B, Covid) - Nasopharyngeal Swab     Status: None   Collection Time: 06/08/19 12:00 AM   Specimen: Nasopharyngeal Swab  Result Value Ref Range Status   SARS Coronavirus 2 by RT PCR NEGATIVE NEGATIVE Final    Comment: (NOTE) SARS-CoV-2 target nucleic acids are NOT DETECTED. The SARS-CoV-2 RNA is generally detectable in upper respiratoy specimens during the acute phase of infection. The lowest concentration of SARS-CoV-2  viral copies this assay can detect is 131 copies/mL. A negative result does not preclude SARS-Cov-2 infection and should not be used as the sole basis for treatment or other patient management decisions. A negative result may occur with  improper specimen collection/handling, submission of specimen other than nasopharyngeal swab, presence of viral mutation(s) within the areas targeted by this assay, and inadequate number of viral copies (<131 copies/mL). A negative result must be combined with clinical observations, patient history, and epidemiological information. The expected result is Negative. Fact Sheet for Patients:  PinkCheek.be Fact Sheet for Healthcare Providers:  GravelBags.it This test is not yet ap proved or cleared by the [place] FDA and  has been authorized for detection and/or diagnosis of SARS-CoV-2 by FDA under an Emergency Use Authorization (EUA). This EUA will remain  in  effect (meaning this test can be used) for the duration of the COVID-19 declaration under Section 564(b)(1) of the Act, 21 U.S.C. section 360bbb-3(b)(1), unless the authorization is terminated or revoked sooner.    Influenza A by PCR NEGATIVE NEGATIVE Final   Influenza B by PCR NEGATIVE NEGATIVE Final    Comment: (NOTE) The Xpert Xpress SARS-CoV-2/FLU/RSV assay is intended as an aid in  the diagnosis of influenza from Nasopharyngeal swab specimens and  should not be used as a sole basis for treatment. Nasal washings and  aspirates are unacceptable for Xpert Xpress SARS-CoV-2/FLU/RSV  testing. Fact Sheet for Patients: PinkCheek.be Fact Sheet for Healthcare Providers: GravelBags.it This test is not yet approved or cleared by the [place] FDA and  has been authorized for detection and/or diagnosis of SARS-CoV-2 by  FDA under an Emergency Use Authorization (EUA). This EUA will remain  in effect (meaning this test can be used) for the duration of the  Covid-19 declaration under Section 564(b)(1) of the Act, 21  U.S.C. section 360bbb-3(b)(1), unless the authorization is  terminated or revoked. Performed at [facility], [employer] [address] [place], [place] [address]      Labs: Basic Metabolic Panel: Recent Labs  Lab 06/07/19 2129 06/08/19 0410 06/09/19 0834  NA 133* 136 136  K 3.8 4.0 4.2  CL 98 99 101  CO2 25 23 24   GLUCOSE 179* 173* 165*  BUN 6 8 18   CREATININE 0.66 0.73 0.73  CALCIUM 8.6* 9.6 9.3  MG  --   --  2.6*   Liver Function Tests: Recent Labs  Lab 06/07/19 2129  AST 19  ALT 21  ALKPHOS 39  BILITOT 0.6  PROT 7.4  ALBUMIN 4.3   No results for input(s): LIPASE, AMYLASE in the last 168 hours. No results for input(s): AMMONIA in the last 168 hours. CBC: Recent Labs  Lab 06/07/19 2129 06/08/19 0410 06/09/19 0834  WBC 10.7* 10.3 15.5*  NEUTROABS 6.6  --  14.0*  HGB 14.8 15.6  14.8  HCT 43.7 46.0 44.8  MCV 93.6 92.2 94.1  PLT 391 372 353   Cardiac Enzymes: No results for input(s): CKTOTAL, CKMB, CKMBINDEX, TROPONINI in the last 168 hours. BNP: BNP (last 3 results) Recent Labs    06/08/19 0410  BNP 45.1    ProBNP (last 3 results) No results for input(s): PROBNP in the last 8760 hours.  CBG: No results for input(s): GLUCAP in the last 168 hours.     Signed:  Irine Seal MD.  [facility] 06/09/2019, 3:24 PM

## 2019-06-09 NOTE — Progress Notes (Signed)
Walked with patient w/o oxygen. O2 sats started at 93% and would occassionally drop to 91% but go back up to 93%. Patient reported no shortness of breath, or dizziness.

## 2019-06-09 NOTE — Progress Notes (Signed)
Went over discharge instructions w/ pt. Pt verbalized understanding.  

## 2019-06-17 ENCOUNTER — Other Ambulatory Visit (HOSPITAL_COMMUNITY): Payer: Self-pay | Admitting: Respiratory Therapy

## 2019-06-17 DIAGNOSIS — R0609 Other forms of dyspnea: Secondary | ICD-10-CM

## 2019-07-16 ENCOUNTER — Encounter (HOSPITAL_COMMUNITY): Payer: 59

## 2020-01-26 ENCOUNTER — Emergency Department
Admission: EM | Admit: 2020-01-26 | Discharge: 2020-01-26 | Disposition: A | Discharge status: Home / Self Care | Payer: 59 | Attending: Emergency Medicine | Admitting: Emergency Medicine

## 2020-01-26 ENCOUNTER — Encounter
Admission: EM | Disposition: A | Discharge status: Home / Self Care | Payer: Self-pay | Source: Home / Self Care | Attending: Emergency Medicine

## 2020-01-26 ENCOUNTER — Encounter: Payer: Self-pay | Admitting: *Deleted

## 2020-01-26 ENCOUNTER — Other Ambulatory Visit: Payer: Self-pay

## 2020-01-26 ENCOUNTER — Emergency Department: Payer: 59 | Admitting: Anesthesiology

## 2020-01-26 DIAGNOSIS — K209 Esophagitis, unspecified without bleeding: Secondary | ICD-10-CM | POA: Diagnosis not present

## 2020-01-26 DIAGNOSIS — T18128A Food in esophagus causing other injury, initial encounter: Secondary | ICD-10-CM | POA: Diagnosis not present

## 2020-01-26 DIAGNOSIS — K200 Eosinophilic esophagitis: Secondary | ICD-10-CM | POA: Insufficient documentation

## 2020-01-26 DIAGNOSIS — K3189 Other diseases of stomach and duodenum: Secondary | ICD-10-CM | POA: Insufficient documentation

## 2020-01-26 DIAGNOSIS — Z20822 Contact with and (suspected) exposure to covid-19: Secondary | ICD-10-CM | POA: Diagnosis not present

## 2020-01-26 DIAGNOSIS — X58XXXA Exposure to other specified factors, initial encounter: Secondary | ICD-10-CM | POA: Diagnosis not present

## 2020-01-26 DIAGNOSIS — R1319 Other dysphagia: Secondary | ICD-10-CM

## 2020-01-26 DIAGNOSIS — Z79899 Other long term (current) drug therapy: Secondary | ICD-10-CM | POA: Diagnosis not present

## 2020-01-26 DIAGNOSIS — W44F3XA Food entering into or through a natural orifice, initial encounter: Secondary | ICD-10-CM

## 2020-01-26 DIAGNOSIS — K222 Esophageal obstruction: Secondary | ICD-10-CM

## 2020-01-26 DIAGNOSIS — R1310 Dysphagia, unspecified: Secondary | ICD-10-CM | POA: Diagnosis not present

## 2020-01-26 DIAGNOSIS — I10 Essential (primary) hypertension: Secondary | ICD-10-CM | POA: Diagnosis not present

## 2020-01-26 DIAGNOSIS — Z8249 Family history of ischemic heart disease and other diseases of the circulatory system: Secondary | ICD-10-CM | POA: Diagnosis not present

## 2020-01-26 HISTORY — PX: ESOPHAGOGASTRODUODENOSCOPY: SHX5428

## 2020-01-26 LAB — CBC
HCT: 44.8 % (ref 39.0–52.0)
Hemoglobin: 15.3 g/dL (ref 13.0–17.0)
MCH: 31.6 pg (ref 26.0–34.0)
MCHC: 34.2 g/dL (ref 30.0–36.0)
MCV: 92.6 fL (ref 80.0–100.0)
Platelets: 344 10*3/uL (ref 150–400)
RBC: 4.84 MIL/uL (ref 4.22–5.81)
RDW: 12 % (ref 11.5–15.5)
WBC: 9.2 10*3/uL (ref 4.0–10.5)
nRBC: 0 % (ref 0.0–0.2)

## 2020-01-26 LAB — COMPREHENSIVE METABOLIC PANEL
ALT: 19 U/L (ref 0–44)
AST: 21 U/L (ref 15–41)
Albumin: 5.3 g/dL — ABNORMAL HIGH (ref 3.5–5.0)
Alkaline Phosphatase: 40 U/L (ref 38–126)
Anion gap: 15 (ref 5–15)
BUN: 22 mg/dL — ABNORMAL HIGH (ref 6–20)
CO2: 23 mmol/L (ref 22–32)
Calcium: 9.6 mg/dL (ref 8.9–10.3)
Chloride: 106 mmol/L (ref 98–111)
Creatinine, Ser: 0.89 mg/dL (ref 0.61–1.24)
GFR calc Af Amer: >60 mL/min (ref >60)
GFR calc non Af Amer: >60 mL/min (ref >60)
Glucose, Bld: 142 mg/dL — ABNORMAL HIGH (ref 70–99)
Potassium: 3.9 mmol/L (ref 3.5–5.1)
Sodium: 144 mmol/L (ref 135–145)
Total Bilirubin: 1.7 mg/dL — ABNORMAL HIGH (ref 0.3–1.2)
Total Protein: 8.5 g/dL — ABNORMAL HIGH (ref 6.5–8.1)

## 2020-01-26 LAB — SARS CORONAVIRUS 2 BY RT PCR (HOSPITAL ORDER, PERFORMED IN ~~LOC~~ HOSPITAL LAB): SARS Coronavirus 2: NEGATIVE

## 2020-01-26 SURGERY — EGD (ESOPHAGOGASTRODUODENOSCOPY)
Anesthesia: General

## 2020-01-26 MED ORDER — SUCCINYLCHOLINE CHLORIDE 20 MG/ML IJ SOLN
INTRAMUSCULAR | Status: DC | PRN
  Administered 2020-01-26: 180 mg via INTRAVENOUS

## 2020-01-26 MED ORDER — OXYCODONE HCL 5 MG/5ML PO SOLN: 5.0000 mg | Freq: Once | ORAL | Status: DC | PRN

## 2020-01-26 MED ORDER — PROPOFOL 10 MG/ML IV BOLUS
INTRAVENOUS | Status: DC | PRN
  Administered 2020-01-26: 20 mg via INTRAVENOUS
  Administered 2020-01-26: 180 mg via INTRAVENOUS
  Administered 2020-01-26 (×2): 20 mg via INTRAVENOUS

## 2020-01-26 MED ORDER — OXYCODONE HCL 5 MG PO TABS: 5.0000 mg | ORAL_TABLET | Freq: Once | ORAL | Status: DC | PRN

## 2020-01-26 MED ORDER — FENTANYL CITRATE (PF) 100 MCG/2ML IJ SOLN
INTRAMUSCULAR | Status: DC | PRN
  Administered 2020-01-26 (×2): 50 ug via INTRAVENOUS

## 2020-01-26 MED ORDER — FENTANYL CITRATE (PF) 100 MCG/2ML IJ SOLN
INTRAMUSCULAR | Status: AC
  Filled 2020-01-26: qty 2

## 2020-01-26 MED ORDER — SUCCINYLCHOLINE CHLORIDE 200 MG/10ML IV SOSY
PREFILLED_SYRINGE | INTRAVENOUS | Status: AC
  Filled 2020-01-26: qty 10

## 2020-01-26 MED ORDER — SODIUM CHLORIDE 0.9 % IV SOLN: INTRAVENOUS | Status: DC

## 2020-01-26 MED ORDER — GLUCAGON HCL RDNA (DIAGNOSTIC) 1 MG IJ SOLR
1.0000 mg | Freq: Once | INTRAMUSCULAR | Status: AC
  Administered 2020-01-26: 1 mg via INTRAVENOUS
  Filled 2020-01-26: qty 1

## 2020-01-26 MED ORDER — FENTANYL CITRATE (PF) 100 MCG/2ML IJ SOLN: 25.0000 ug | INTRAMUSCULAR | Status: DC | PRN

## 2020-01-26 MED ORDER — LIDOCAINE HCL (CARDIAC) PF 100 MG/5ML IV SOSY
PREFILLED_SYRINGE | INTRAVENOUS | Status: DC | PRN
  Administered 2020-01-26: 100 mg via INTRAVENOUS

## 2020-01-26 MED ORDER — PROPOFOL 10 MG/ML IV BOLUS
INTRAVENOUS | Status: AC
  Filled 2020-01-26: qty 40

## 2020-01-26 MED ORDER — OMEPRAZOLE 20 MG PO CPDR
20.0000 mg | DELAYED_RELEASE_CAPSULE | Freq: Two times a day (BID) | ORAL | 0 refills | Status: DC
Start: 2020-01-26 — End: 2020-07-28

## 2020-01-26 MED ORDER — PROPOFOL 10 MG/ML IV BOLUS
INTRAVENOUS | Status: AC
  Filled 2020-01-26: qty 60

## 2020-01-26 NOTE — ED Notes (Signed)
Pt reports improvement of symptoms. PO fluids given to patient per MD Paduchowski to PO trial at this time.

## 2020-01-26 NOTE — ED Notes (Signed)
Pt unable to tolerate PO fluids. Pt vomited PO liquid immediately after trying to drink. MD aware.

## 2020-01-26 NOTE — Consult Note (Signed)
Thomas Antigua, MD [address], [place], [place], [place], [address] [address], [place], [place], [place], [address] Phone: [phone]  Fax: [phone]  Consultation  Referring Provider:     Dr. Kerman Passey Primary Care Physician:  Shon Baton, MD Reason for Consultation:    Food impaction  Date of Admission:  01/26/2020 Date of Consultation:  01/26/2020         HPI:   Thomas Kramer is a 49 y.o. male who presents with inability to swallow liquids or solids after eating beef throat yesterday night.  Reports previous history of dysphagia prior to this.  No prior history of food impaction.  No prior endoscopy.  Glucagon was given in the ER with no benefit.  Past Medical History:  Diagnosis Date  . Hypertension     Past Surgical History:  Procedure Laterality Date  . HERNIA REPAIR      Prior to Admission medications   Medication Sig Start Date End Date Taking? Authorizing Provider  hydrochlorothiazide (HYDRODIURIL) 25 MG tablet Take 25 mg by mouth every morning. 01/12/20  Yes [provider]  labetalol (NORMODYNE) 200 MG tablet Take 200 mg by mouth 2 (two) times daily.     Yes [provider]  losartan (COZAAR) 100 MG tablet Take 100 mg by mouth at bedtime. 01/12/20  Yes [provider]    Family History  Problem Relation Age of Onset  . Heart disease Father      Social History   Tobacco Use  . Smoking status: Never Smoker  . Smokeless tobacco: Never Used  Substance Use Topics  . Alcohol use: Yes  . Drug use: Not Currently    Allergies as of 01/26/2020  . (No Known Allergies)    Review of Systems:    All systems reviewed and negative except where noted in HPI.   Physical Exam:  Vital signs in last 24 hours: Vitals:   01/26/20 1807 01/26/20 1809 01/26/20 2201  BP: (!) 156/106  (!) 149/111  Pulse: 100  82  Resp: 20  18  Temp: 99.8 F (37.7 C)  97.6 F (36.4 C)  TempSrc: Oral  Temporal  SpO2: 98%  100%  Weight:   81.6 kg 81.6 kg  Height:  6' (1.829 m) 6' (1.829 m)     General:   Pleasant, cooperative in NAD Head:  Normocephalic and atraumatic. Eyes:   No icterus.   Conjunctiva pink. PERRLA. Ears:  Normal auditory acuity. Neck:  Supple; no masses or thyroidomegaly Lungs: Respirations even and unlabored. Lungs clear to auscultation bilaterally.   No wheezes, crackles, or rhonchi.  Abdomen:  Soft, nondistended, nontender. Normal bowel sounds. No appreciable masses or hepatomegaly.  No rebound or guarding.  Neurologic:  Alert and oriented x3;  grossly normal neurologically. Skin:  Intact without significant lesions or rashes. Cervical Nodes:  No significant cervical adenopathy. Psych:  Alert and cooperative. Normal affect.  LAB RESULTS: Recent Labs    01/26/20 1822  WBC 9.2  HGB 15.3  HCT 44.8  PLT 344   BMET Recent Labs    01/26/20 1822  NA 144  K 3.9  CL 106  CO2 23  GLUCOSE 142*  BUN 22*  CREATININE 0.89  CALCIUM 9.6   LFT Recent Labs    01/26/20 1822  PROT 8.5*  ALBUMIN 5.3*  AST 21  ALT 19  ALKPHOS 40  BILITOT 1.7*   PT/INR No results for input(s): LABPROT, INR in the last 72 hours.  STUDIES: No results found.    Impression / Plan:   Thomas Kramer is a 49 y.o. y/o male with symptoms of food impaction after swallowing beef roast yesterday night, with previous symptoms of dysphagia prior to that  EGD indicated for further evaluation of food bolus and removal of the same  Patient will need follow-up in GI clinic after this, for further treatment plans and long-term follow-up.  He verbalized understanding of the same  I have discussed alternative options, risks & benefits,  which include, but are not limited to, bleeding, infection, perforation,respiratory complication & drug reaction.  The patient agrees with this plan & written consent will be obtained.     Thank you for involving me in the care of this patient.      LOS: 0 days   Virgel Manifold,  MD  01/26/2020, 10:07 PM

## 2020-01-26 NOTE — ED Notes (Signed)
Handoff given to Endo RN

## 2020-01-26 NOTE — H&P (Signed)
Thomas Antigua, MD [address], [place], [place], [place], [address] [address], [place], [place], [place], [address] Phone: [phone]  Fax: [phone]  Primary Care Physician:  Shon Baton, MD   Pre-Procedure History & Physical: HPI:  Thomas Kramer is a 49 y.o. male is here for an EGD.   Past Medical History:  Diagnosis Date  . Hypertension     Past Surgical History:  Procedure Laterality Date  . HERNIA REPAIR      Prior to Admission medications   Medication Sig Start Date End Date Taking? Authorizing Provider  hydrochlorothiazide (HYDRODIURIL) 25 MG tablet Take 25 mg by mouth every morning. 01/12/20  Yes [provider]  labetalol (NORMODYNE) 200 MG tablet Take 200 mg by mouth 2 (two) times daily.     Yes [provider]  losartan (COZAAR) 100 MG tablet Take 100 mg by mouth at bedtime. 01/12/20  Yes [provider]    Allergies as of 01/26/2020  . (No Known Allergies)    Family History  Problem Relation Age of Onset  . Heart disease Father     Social History   Socioeconomic History  . Marital status: Married    Spouse name: Not on file  . Number of children: Not on file  . Years of education: Not on file  . Highest education level: Not on file  Occupational History  . Not on file  Tobacco Use  . Smoking status: Never Smoker  . Smokeless tobacco: Never Used  Vaping Use  . Vaping Use: Never used  Substance and Sexual Activity  . Alcohol use: Yes  . Drug use: Not Currently  . Sexual activity: Not on file  Other Topics Concern  . Not on file  Social History Narrative  . Not on file   Social Determinants of Health   Financial Resource Strain:   . Difficulty of Paying Living Expenses:   Food Insecurity:   . Worried About Charity fundraiser in the Last Year:   . Arboriculturist in the Last Year:   Transportation Needs:   . Film/video editor (Medical):   Marland Kitchen Lack of Transportation (Non-Medical):     Physical Activity:   . Days of Exercise per Week:   . Minutes of Exercise per Session:   Stress:   . Feeling of Stress :   Social Connections:   . Frequency of Communication with Friends and Family:   . Frequency of Social Gatherings with Friends and Family:   . Attends Religious Services:   . Active Member of Clubs or Organizations:   . Attends Archivist Meetings:   Marland Kitchen Marital Status:   Intimate Partner Violence:   . Fear of Current or Ex-Partner:   . Emotionally Abused:   Marland Kitchen Physically Abused:   . Sexually Abused:     Review of Systems: See HPI, otherwise negative ROS  Physical Exam: BP (!) 149/111   Pulse 82   Temp 97.6 F (36.4 C) (Temporal)   Resp 18   Ht 6' (1.829 m)   Wt 81.6 kg   SpO2 100%   BMI 24.41 kg/m  General:   Alert,  pleasant and cooperative in NAD Head:  Normocephalic and atraumatic. Neck:  Supple; no masses or thyromegaly. Lungs:  Clear throughout to auscultation, normal respiratory effort.    Heart:  +S1, +S2, Regular rate and rhythm, No edema. Abdomen:  Soft, nontender and nondistended. Normal bowel sounds, without guarding, and without rebound.  Neurologic:  Alert and  oriented x4;  grossly normal neurologically.  Impression/Plan: Salem Caster is here for an EGD for dysphagia, food impaction  Risks, benefits, limitations, and alternatives regarding the procedure have been reviewed with the patient.  Questions have been answered.  All parties agreeable.   Virgel Manifold, MD  01/26/2020, 10:12 PM

## 2020-01-26 NOTE — ED Provider Notes (Signed)
[facility] Emergency Department Provider Note  Time seen: 8:21 PM  I have reviewed the triage vital signs and the nursing notes.   HISTORY  Chief Complaint Foreign Body   HPI Thomas Kramer is a 49 y.o. male with a past medical history of hypertension presents to the emergency department for possible esophageal foreign body.  According to the patient he was eating red meat last night when he felt like it got stuck.  States this is happened before but it has cleared on its own.  However today he tried to eat and drink but has not been able to keep anything down.  States anytime he swallows it comes back up.  Denies any abdominal or chest pain.  Has never required an esophageal dilation previously.   Past Medical History:  Diagnosis Date  . Hypertension     Patient Active Problem List   Diagnosis Date Noted  . Reactive airway disease with wheezing 06/09/2019  . Leukocytosis 06/09/2019  . Acute respiratory failure with hypoxia ([place]) 06/08/2019  . Foot arch pain 07/04/2011  . HTN (hypertension) 07/04/2011  . 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 HERNIA REPAIR      Prior to Admission medications   Medication Sig Start Date End Date Taking? Authorizing Provider  BEPREVE 1.5 % SOLN  07/27/14   [provider]  fluticasone (FLONASE) 50 MCG/ACT nasal spray Place 2 sprays into both nostrils daily. 06/10/19   Eugenie Filler, MD  Ipratropium-Albuterol (COMBIVENT RESPIMAT) 20-100 MCG/ACT AERS respimat Inhale 1 puff into the lungs every 6 (six) hours as needed for wheezing or shortness of breath. Use 3 times daily x 5 days then every 6 hours as needed. 06/09/19   Eugenie Filler, MD  labetalol (NORMODYNE) 200 MG tablet Take 200 mg by mouth 2 (two) times daily.      [provider]  loratadine (CLARITIN) 10 MG tablet Take 1 tablet (10 mg total) by mouth daily. 06/10/19   Eugenie Filler, MD  losartan-hydrochlorothiazide (HYZAAR) 50-12.5 MG per tablet Take 1 tablet by mouth daily.      [provider]  pantoprazole (PROTONIX) 40 MG tablet Take 1 tablet (40 mg total) by mouth daily. 06/10/19   Eugenie Filler, MD  predniSONE (DELTASONE) 20 MG tablet Take 1-3 tablets (20-60 mg total) by mouth daily with breakfast. Take 3 tablets (60mg ) daily x 3 days, then 2 tablets (40mg ) daily x 3 days, then 1 tablet (20mg ) daily x 3 days then stop. 06/09/19   Eugenie Filler, MD    No Known Allergies  Family History  Problem Relation Age of Onset  . Heart disease Father     Social History Social History   Tobacco Use  . Smoking status: Never Smoker  . Smokeless tobacco: Never Used  Substance Use Topics  . Alcohol use: Yes  . Drug use: Not Currently    Review of Systems Constitutional: Negative for fever. Cardiovascular: Negative for chest pain. Respiratory: Negative for shortness of breath. Gastrointestinal: Negative for abdominal pain.  Sensation of something stuck in his esophagus. Genitourinary: Negative for urinary compaints Musculoskeletal: Negative for musculoskeletal complaints Neurological: Negative for headache All other ROS negative  ____________________________________________   PHYSICAL EXAM:  VITAL SIGNS: ED Triage Vitals  Enc Vitals Group     BP 01/26/20 1807 (!) 156/106     Pulse Rate 01/26/20 1807 100     Resp 01/26/20  1807 20     Temp 01/26/20 1807 99.8 F (37.7 C)     Temp Source 01/26/20 1807 Oral     SpO2 01/26/20 1807 98 %     Weight 01/26/20 1809 180 lb (81.6 kg)     Height 01/26/20 1809 6' (1.829 m)     Head Circumference --      Peak Flow --      Pain Score 01/26/20 1809 0     Pain Loc --      Pain Edu? --      Excl. in [place]? --     Constitutional: Alert and oriented. Well appearing and in no distress.  Appears to be handling secretions. Eyes: Normal exam ENT      Head: Normocephalic and atraumatic.       Mouth/Throat: Mucous membranes are moist. Cardiovascular: Normal rate, regular rhythm.  Respiratory: Normal respiratory effort without tachypnea nor retractions. Breath sounds are clear Gastrointestinal: Soft and nontender. No distention.   Musculoskeletal: Nontender with normal range of motion in all extremities.  Neurologic:  Normal speech and language. No gross focal neurologic deficits Skin:  Skin is warm, dry and intact.  Psychiatric: Mood and affect are normal.  ____________________________________________   INITIAL IMPRESSION / ASSESSMENT AND PLAN / ED COURSE  Pertinent labs & imaging results that were available during my care of the patient were reviewed by me and considered in my medical decision making (see chart for details).   Patient presents emergency department for foreign body sensation.  States he was eating red meat last night and feels like it is stuck in his throat has not been able to eat or drink anything since.  Patient states similar events in the past that have cleared on their own but is never had to come to the emergency department.  Patient unable to tolerate oral in the emergency department.  We will dose glucagon and reassess.  Patient's basic lab work is largely within normal limits.  Patient has failed glucagon trial.  Unable to swallow liquids, immediately vomits.  Will discuss with GI medicine for possible endoscopy.  Spoke to Dr. Bonna Gains will be in to see the patient and perform endoscopy tonight.  Thomas Kramer was evaluated in Emergency Department on 01/26/2020 for the symptoms described in the history of present illness. He was evaluated in the context of the global COVID-19 pandemic, which necessitated consideration that the patient might be at risk for infection with the SARS-CoV-2 virus that causes COVID-19. Institutional protocols and algorithms that pertain to the evaluation of patients at risk for COVID-19 are in a state of rapid change based on  information released by regulatory bodies including the CDC and federal and state organizations. These policies and algorithms were followed during the patient's care in the ED.  ____________________________________________   FINAL CLINICAL IMPRESSION(S) / ED DIAGNOSES  Esophageal food bolus impaction   Harvest Dark, MD 01/26/20 2118

## 2020-01-26 NOTE — Anesthesia Preprocedure Evaluation (Signed)
Anesthesia Evaluation  Patient identified by MRN, date of birth, ID band Patient awake    Reviewed: Allergy & Precautions, H&P , NPO status , Patient's Chart, lab work & pertinent test results  History of Anesthesia Complications Negative for: history of anesthetic complications  Airway Mallampati: II  TM Distance: >3 FB Neck ROM: full    Dental  (+) Chipped   Pulmonary neg pulmonary ROS, neg shortness of breath, Patient abstained from smoking.,    Pulmonary exam normal        Cardiovascular Exercise Tolerance: Good hypertension, Normal cardiovascular exam     Neuro/Psych negative neurological ROS  negative psych ROS   GI/Hepatic negative GI ROS, Neg liver ROS, neg GERD  ,  Endo/Other  negative endocrine ROS  Renal/GU      Musculoskeletal   Abdominal   Peds  Hematology negative hematology ROS (+)   Anesthesia Other Findings Food Bolus  Past Medical History: No date: Hypertension  Past Surgical History: No date: HERNIA REPAIR  BMI    Body Mass Index: 24.41 kg/m      Reproductive/Obstetrics negative OB ROS                             Anesthesia Physical Anesthesia Plan  ASA: IV and emergent  Anesthesia Plan: General ETT, Rapid Sequence and Cricoid Pressure   Post-op Pain Management:    Induction: Intravenous  PONV Risk Score and Plan: Ondansetron, Dexamethasone, Midazolam and Treatment may vary due to age or medical condition  Airway Management Planned: Oral ETT  Additional Equipment:   Intra-op Plan:   Post-operative Plan: Extubation in OR  Informed Consent: I have reviewed the patients History and Physical, chart, labs and discussed the procedure including the risks, benefits and alternatives for the proposed anesthesia with the patient or authorized representative who has indicated his/her understanding and acceptance.     Dental Advisory Given  Plan  Discussed with: Anesthesiologist, CRNA and Surgeon  Anesthesia Plan Comments: (Patient consented for risks of anesthesia including but not limited to:  - adverse reactions to medications - damage to eyes, teeth, lips or other oral mucosa - nerve damage due to positioning  - sore throat or hoarseness - Damage to heart, brain, nerves, lungs, other parts of body or loss of life  Patient voiced understanding.)        Anesthesia Quick Evaluation

## 2020-01-26 NOTE — Transfer of Care (Signed)
Immediate Anesthesia Transfer of Care Note  Patient: Thomas Kramer  Procedure(s) Performed: ESOPHAGOGASTRODUODENOSCOPY (EGD) (N/A )  Patient Location: PACU  Anesthesia Type:General  Level of Consciousness: awake and alert   Airway & Oxygen Therapy: Patient Spontanous Breathing  Post-op Assessment: Report given to RN  Post vital signs: Reviewed and stable  Last Vitals:  Vitals Value Taken Time  BP 135/94 01/26/20 2316  Temp 36.3 C 01/26/20 2316  Pulse 97 01/26/20 2323  Resp 20 01/26/20 2323  SpO2 95 % 01/26/20 2323  Vitals shown include unvalidated device data.  Last Pain:  Vitals:   01/26/20 2316  TempSrc:   PainSc: 0-No pain         Complications: No complications documented.

## 2020-01-26 NOTE — Anesthesia Procedure Notes (Signed)
Procedure Name: Intubation Date/Time: 01/26/2020 10:16 PM Performed by: Lendon Colonel, CRNA Pre-anesthesia Checklist: Patient identified, Patient being monitored, Timeout performed, Emergency Drugs available and Suction available Patient Re-evaluated:Patient Re-evaluated prior to induction Oxygen Delivery Method: Circle system utilized Preoxygenation: Pre-oxygenation with 100% oxygen Induction Type: IV induction and Rapid sequence Laryngoscope Size: 3 and McGraph Grade View: Grade I Tube type: Oral Tube size: 7.5 mm Number of attempts: 2 Airway Equipment and Method: Stylet Placement Confirmation: ETT inserted through vocal cords under direct vision,  positive ETCO2 and breath sounds checked- equal and bilateral Secured at: 22 cm Tube secured with: Tape Dental Injury: Teeth and Oropharynx as per pre-operative assessment  Difficulty Due To: Difficult Airway- due to anterior larynx

## 2020-01-26 NOTE — Op Note (Signed)
[facility] Gastroenterology Patient Name: Thomas Kramer Procedure Date: 01/26/2020 9:42 PM MRN: [id number] Account #: [id number] Date of Birth: 30-Aug-1970 Admit Type: Outpatient Age: 49 Room: [facility] ENDO ROOM 4 Gender: Male Note Status: Finalized Procedure:             Upper GI endoscopy Indications:           Dysphagia, Foreign body in the esophagus Providers:             Dawn Convery B. Bonna Gains MD, MD Medicines:             Monitored Anesthesia Care Complications:         No immediate complications. Procedure:             Pre-Anesthesia Assessment:                        - The risks and benefits of the procedure and the                         sedation options and risks were discussed with the                         patient. All questions were answered and informed                         consent was obtained.                        - Patient identification and proposed procedure were                         verified prior to the procedure.                        - ASA Grade Assessment: II - A patient with mild                         systemic disease.                        After obtaining informed consent, the endoscope was                         passed under direct vision. Throughout the procedure,                         the patient's blood pressure, pulse, and oxygen                         saturations were monitored continuously. The Endoscope                         was introduced through the mouth, and advanced to the                         second part of duodenum. The upper GI endoscopy was                         accomplished with ease. The patient tolerated the  procedure well. Findings:      Food was found in the distal esophagus. Removal of food was accomplished       with a basket, Tripod and by advancing part of the bolus into the       stomach. Removal of food was accomplished.      Esophagitis with no bleeding was found  in the distal esophagus. This was       present at the site of the impaction.      Mucosal changes including longitudinal markings were found in the entire       esophagus. Biopsies were obtained from the proximal and distal esophagus       with cold forceps for histology of suspected eosinophilic esophagitis.      Patchy mildly erythematous mucosa without bleeding was found in the       entire examined stomach.      The exam of the stomach was otherwise normal.      The examined duodenum was normal. Impression:            - Food in the distal esophagus. Removal was successful.                        - Esophagitis with no bleeding.                        - Esophageal mucosal changes suggestive of                         eosinophilic esophagitis. Biopsied.                        - Erythematous mucosa in the stomach.                        - Normal examined duodenum. Recommendation:        - Use Prilosec (omeprazole) 20 mg PO BID.                        - Await pathology results.                        - Mechanical soft diet and soft diet.                        - Return to my office in 2 weeks.                        - The findings and recommendations were discussed with                         the patient.                        - The findings and recommendations were discussed with                         the patient's family.                        - Return to primary care physician in 4 weeks. Procedure Code(s):     --- Professional ---  [phone], Esophagogastroduodenoscopy, flexible,                         transoral; with removal of foreign body(s)                        43239, Esophagogastroduodenoscopy, flexible,                         transoral; with biopsy, single or multiple Diagnosis Code(s):     --- Professional ---                        H37.169C, Food in esophagus causing other injury,                         initial encounter                         K20.90, Esophagitis, unspecified without bleeding                        K22.8, Other specified diseases of esophagus                        K31.89, Other diseases of stomach and duodenum                        R13.10, Dysphagia, unspecified                        T18.108A, Unspecified foreign body in esophagus                         causing other injury, initial encounter CPT copyright 2019 American Medical Association. All rights reserved. The codes documented in this report are preliminary and upon coder review may  be revised to meet current compliance requirements.  [name], MD Margretta Sidle B. Bonna Gains MD, MD 01/26/2020 11:12:40 PM This report has been signed electronically. Number of Addenda: 0 Note Initiated On: 01/26/2020 9:42 PM Estimated Blood Loss:  Estimated blood loss: none.      [facility]

## 2020-01-26 NOTE — ED Triage Notes (Signed)
Pt reports eating roast last night.  Pt states it is hung in my throat.  Pt unable to keep food or liquid down.  No resp distress.  Pt alert  Speech clear.

## 2020-01-27 NOTE — Anesthesia Postprocedure Evaluation (Signed)
Anesthesia Post Note  Patient: Thomas Kramer  Procedure(s) Performed: ESOPHAGOGASTRODUODENOSCOPY (EGD) (N/A )  Patient location during evaluation: PACU Anesthesia Type: General Level of consciousness: awake and alert Pain management: pain level controlled Vital Signs Assessment: post-procedure vital signs reviewed and stable Respiratory status: spontaneous breathing, nonlabored ventilation, respiratory function stable and patient connected to nasal cannula oxygen Cardiovascular status: blood pressure returned to baseline and stable Postop Assessment: no apparent nausea or vomiting Anesthetic complications: no   No complications documented.   Last Vitals:  Vitals:   01/26/20 2335 01/26/20 2340  BP: (!) 151/93 (!) 152/101  Pulse: 97 100  Resp: 20 20  Temp:  36.4 C  SpO2: 96% 96%    Last Pain:  Vitals:   01/26/20 2340  TempSrc:   PainSc: 0-No pain                 Precious Haws Marvia Troost

## 2020-01-28 ENCOUNTER — Encounter: Payer: Self-pay | Admitting: Gastroenterology

## 2020-01-28 LAB — SURGICAL PATHOLOGY

## 2020-03-11 ENCOUNTER — Encounter: Payer: Self-pay | Admitting: Gastroenterology

## 2020-03-11 ENCOUNTER — Ambulatory Visit: Payer: 59 | Admitting: Gastroenterology

## 2020-03-11 ENCOUNTER — Other Ambulatory Visit: Payer: Self-pay

## 2020-03-11 VITALS — BP 168/118 | HR 77 | Temp 98.0°F | Ht 72.0 in | Wt 182.0 lb

## 2020-03-11 DIAGNOSIS — K200 Eosinophilic esophagitis: Secondary | ICD-10-CM

## 2020-03-11 DIAGNOSIS — Z1211 Encounter for screening for malignant neoplasm of colon: Secondary | ICD-10-CM | POA: Diagnosis not present

## 2020-03-11 MED ORDER — PEG 3350-KCL-NA BICARB-NACL 420 G PO SOLR: ORAL | 0 refills | Status: DC

## 2020-03-12 NOTE — Progress Notes (Signed)
[name], MD [address]  [place]  [place], [place] [address]  Main: [phone]  Fax: [phone]   Primary Care Physician: Shon Baton, MD   Chief Complaint  Patient presents with  . Eosiphilic esophagitis    HPI: Thomas Kramer is a 49 y.o. male here for hospital follow-up of food impaction.  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  Patient has been asymptomatic and has been eating multiple solids without any difficulty.  No further dysphagia. The patient denies abdominal or flank pain, anorexia, nausea or vomiting, dysphagia, change in bowel habits or black or bloody stools or weight loss.   Current Outpatient Medications  Medication Sig Dispense Refill  . FLOVENT HFA 220 MCG/ACT inhaler 1 puff 2 (two) times daily.    . hydrochlorothiazide (HYDRODIURIL) 25 MG tablet Take 25 mg by mouth every morning.    Marland Kitchen losartan (COZAAR) 100 MG tablet Take 100 mg by mouth at bedtime.    Marland Kitchen omeprazole (PRILOSEC) 20 MG capsule Take 1 capsule (20 mg total) by mouth 2 (two) times daily. 60 capsule 0  . labetalol (NORMODYNE) 200 MG tablet Take 200 mg by mouth 2 (two) times daily.   (Patient not taking: Reported on 03/11/2020)    . polyethylene glycol-electrolytes (NULYTELY) 420 g solution Prepare according to package instructions. Starting at 5:00 PM: Drink one 8 oz glass of mixture every 15 minutes until you finish half of the jug. Five hours prior to procedure, drink 8 oz glass of mixture every 15 minutes until it is all gone. Make sure you do not drink anything 4 hours prior to your procedure. 4000 mL 0   No current facility-administered medications for this visit.    Allergies as of 03/11/2020  . (No Known Allergies)    ROS:  General: Negative for anorexia, weight loss, fever, chills, fatigue, weakness. ENT: Negative for hoarseness, difficulty swallowing ,  nasal congestion. CV: Negative for chest pain, angina, palpitations, dyspnea on exertion, peripheral edema.  Respiratory: Negative for dyspnea at rest, dyspnea on exertion, cough, sputum, wheezing.  GI: See history of present illness. GU:  Negative for dysuria, hematuria, urinary incontinence, urinary frequency, nocturnal urination.  Endo: Negative for unusual weight change.    Physical Examination:   BP (!) 168/118   Pulse 77   Temp 98 F (36.7 C) (Oral)   Ht 6' (1.829 m)   Wt 182 lb (82.6 kg)   BMI 24.68 kg/m   General: Well-nourished, well-developed in no acute distress.  Eyes: No icterus. Conjunctivae pink. Mouth: Oropharyngeal mucosa moist and pink , no lesions erythema or exudate. Neck: Supple, Trachea midline Abdomen: Bowel sounds are normal, nontender, nondistended, no hepatosplenomegaly or masses, no abdominal bruits or hernia , no rebound or guarding.   Extremities: No lower extremity edema. No clubbing or deformities. Neuro: Alert and oriented x 3.  Grossly intact. Skin: Warm and dry, no jaundice.   Psych: Alert and cooperative, normal mood and affect.   Labs: CMP     Component Value Date/Time   NA 144 01/26/2020 1822   K 3.9 01/26/2020 1822   CL 106 01/26/2020 1822   CO2 23 01/26/2020 1822   GLUCOSE 142 (H) 01/26/2020 1822   BUN 22 (H) 01/26/2020 1822   CREATININE 0.89 01/26/2020 1822   CALCIUM 9.6 01/26/2020 1822   PROT 8.5 (H) 01/26/2020 1822   ALBUMIN 5.3 (H)  01/26/2020 1822   AST 21 01/26/2020 1822   ALT 19 01/26/2020 1822   ALKPHOS 40 01/26/2020 1822   BILITOT 1.7 (H) 01/26/2020 1822   GFRNONAA >60 01/26/2020 1822   GFRAA >60 01/26/2020 1822   Lab Results  Component Value Date   WBC 9.2 01/26/2020   HGB 15.3 01/26/2020   HCT 44.8 01/26/2020   MCV 92.6 01/26/2020   PLT 344 01/26/2020    Imaging Studies: No results found.  Assessment and Plan:   Thomas Kramer is a 49 y.o. y/o male with recent food impaction and biopsies consistent with  EOE  Continue PPI at current dose Repeat EGD for assessment of histologic remission of EOE  Patient also due for screening colonoscopy  (Risks of PPI use were discussed with patient including bone loss, C. Diff diarrhea, pneumonia, infections, CKD, electrolyte abnormalities.  Pt. Verbalizes understanding and chooses to continue the medication.)   I have discussed alternative options, risks & benefits,  which include, but are not limited to, bleeding, infection, perforation,respiratory complication & drug reaction.  The patient agrees with this plan & written consent will be obtained.       Dr [name]

## 2020-04-14 ENCOUNTER — Telehealth: Payer: Self-pay | Admitting: Gastroenterology

## 2020-04-14 NOTE — Telephone Encounter (Signed)
Looked into patient's chart and saw that his procedure had been cancelled.

## 2020-04-14 NOTE — Telephone Encounter (Signed)
Patient call to cancel procedure date scheduled on 04/20/2020. His job is sending him out of town. Patient will call back to reschedule procedure.

## 2020-04-16 ENCOUNTER — Other Ambulatory Visit: Payer: 59

## 2020-04-20 ENCOUNTER — Ambulatory Visit: Admit: 2020-04-20 | Payer: 59 | Admitting: Gastroenterology

## 2020-04-20 SURGERY — COLONOSCOPY WITH PROPOFOL
Anesthesia: Choice

## 2020-06-23 ENCOUNTER — Other Ambulatory Visit: Payer: Self-pay | Admitting: Internal Medicine

## 2020-06-23 DIAGNOSIS — I712 Thoracic aortic aneurysm, without rupture, unspecified: Secondary | ICD-10-CM

## 2020-07-28 ENCOUNTER — Encounter: Payer: Self-pay | Admitting: Family Medicine

## 2020-07-28 ENCOUNTER — Ambulatory Visit: Payer: 59 | Admitting: Family Medicine

## 2020-07-28 ENCOUNTER — Other Ambulatory Visit: Payer: Self-pay

## 2020-07-28 VITALS — BP 121/81 | HR 94 | Ht 72.0 in | Wt 184.8 lb

## 2020-07-28 DIAGNOSIS — L409 Psoriasis, unspecified: Secondary | ICD-10-CM | POA: Insufficient documentation

## 2020-07-28 DIAGNOSIS — I10 Essential (primary) hypertension: Secondary | ICD-10-CM | POA: Diagnosis not present

## 2020-07-28 DIAGNOSIS — R739 Hyperglycemia, unspecified: Secondary | ICD-10-CM

## 2020-07-28 DIAGNOSIS — I77810 Thoracic aortic ectasia: Secondary | ICD-10-CM | POA: Insufficient documentation

## 2020-07-28 DIAGNOSIS — K200 Eosinophilic esophagitis: Secondary | ICD-10-CM | POA: Diagnosis not present

## 2020-07-28 DIAGNOSIS — Z7689 Persons encountering health services in other specified circumstances: Secondary | ICD-10-CM | POA: Diagnosis not present

## 2020-07-28 NOTE — Progress Notes (Signed)
Office Visit Note   Patient: Thomas Kramer           Date of Birth: 06-02-1971           MRN: [id number] Visit Date: 07/28/2020 Requested by: Shon Baton, [address] [place],  [place] [id number] PCP: Shon Baton, MD  Subjective: Chief Complaint  Patient presents with  . Other    Establish primary care    HPI: He is here to establish care.  He and his previous PCP had minor disagreement.  Last month he tested positive for COVID-19.  He recovered after a few days without any troubles.  2 years ago he was hospitalized for pneumonia.  It was a bacterial pneumonia.  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  Annual imaging was recommended, he has not had that done yet.  He is asymptomatic.  He has hypertension which has been controlled with losartan and labetalol along with hydrochlorothiazide.  Ultimately he hopes to get off these medications, but he is not having any side effects with them and his blood pressure seems to be well controlled on this regimen.  He has a history of eosinophilic esophagitis.  He took omeprazole for a while.  He is no longer taking that.  He is trying to quit tobacco.  He does not smoke, but he uses chewing tobacco.  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                ROS:   All other systems were reviewed and are negative.  Objective: Vital Signs: BP 121/81   Pulse 94   Ht 6' (1.829 m)   Wt 184 lb 12.8 oz (83.8 kg)   BMI 25.06 kg/m   Physical Exam:  General:  Alert and oriented, in no acute distress. Pulm:  Breathing unlabored. Psy:  Normal mood, congruent affect.  Neck: No carotid bruits, no thyromegaly.  2+ carotid pulses. CV: Regular rate and rhythm without murmurs, rubs, or gallops.  No peripheral edema.  2+ radial and posterior tibial pulses. Lungs: Clear to auscultation throughout with no wheezing or areas of consolidation.    Imaging: No  results found.  Assessment & Plan: 1.  Visit to establish care -Follow-up in 6 to 12 months.  2.  Hypertension, well controlled. -Add magnesium.  I will make some dietary recommendations.  3.  Dilation of a sending aorta -MRI angiogram to evaluate.  4.  History of psoriasis, currently manageable.  5.  Eosinophilic esophagitis - Try food elimination diet.     Procedures: No procedures performed        PMFS History: Patient Active Problem List   Diagnosis Date Noted  . Eosinophilic esophagitis 98/92/1194  . Hyperglycemia 07/28/2020  . Dilatation of thoracic aorta ([place]) 07/28/2020  . Esophageal obstruction due to food impaction   . Food impaction of esophagus   . Acute esophagitis   . Esophageal dysphagia   . Reactive airway disease with wheezing 06/09/2019  . Leukocytosis 06/09/2019  . Acute respiratory failure with hypoxia ([place]) 06/08/2019  . Foot arch pain 07/04/2011  . Essential hypertension 07/04/2011  . Plantar fascial fibromatosis 07/04/2011   Past Medical History:  Diagnosis Date  . Hypertension     Family History  Problem Relation Age of Onset  . Heart disease Father   . Cancer Father   . Kidney cancer Father   .  Diabetes Neg Hx     Past Surgical History:  Procedure Laterality Date  . ESOPHAGOGASTRODUODENOSCOPY N/A 01/26/2020   Procedure: ESOPHAGOGASTRODUODENOSCOPY (EGD);  Surgeon: Virgel Manifold, MD;  Location: [facility] ENDOSCOPY;  Service: Endoscopy;  Laterality: N/A;  . HERNIA REPAIR     Social History   Occupational History  . Not on file  Tobacco Use  . Smoking status: Never Smoker  . Smokeless tobacco: Never Used  Vaping Use  . Vaping Use: Never used  Substance and Sexual Activity  . Alcohol use: Yes  . Drug use: Not Currently  . Sexual activity: Not on file

## 2020-08-16 ENCOUNTER — Ambulatory Visit
Admission: RE | Admit: 2020-08-16 | Discharge: 2020-08-16 | Disposition: A | Discharge status: Home / Self Care | Payer: 59 | Source: Ambulatory Visit | Attending: Family Medicine | Admitting: Family Medicine

## 2020-08-16 ENCOUNTER — Other Ambulatory Visit: Payer: Self-pay

## 2020-08-16 DIAGNOSIS — I77810 Thoracic aortic ectasia: Secondary | ICD-10-CM

## 2020-08-16 MED ORDER — GADOBENATE DIMEGLUMINE 529 MG/ML IV SOLN
17.0000 mL | Freq: Once | INTRAVENOUS | Status: AC | PRN
  Administered 2020-08-16: 17 mL via INTRAVENOUS

## 2020-08-17 ENCOUNTER — Telehealth: Payer: Self-pay | Admitting: Family Medicine

## 2020-08-17 NOTE — Telephone Encounter (Signed)
MRI of the chest shows that the a sending aorta measures 4.0 cm compared to 4.1 cm previously.  This is reassuring.  Would recommend repeating this test in a year to ensure stability.

## 2020-09-07 ENCOUNTER — Other Ambulatory Visit: Payer: Self-pay | Admitting: Family Medicine

## 2021-03-08 ENCOUNTER — Other Ambulatory Visit: Payer: Self-pay

## 2021-03-08 MED ORDER — LABETALOL HCL 200 MG PO TABS: 200.0000 mg | ORAL_TABLET | Freq: Two times a day (BID) | ORAL | 0 refills | Status: DC

## 2021-07-29 ENCOUNTER — Other Ambulatory Visit: Payer: Self-pay | Admitting: Family Medicine

## 2021-07-29 DIAGNOSIS — I7121 Aneurysm of the ascending aorta, without rupture: Secondary | ICD-10-CM

## 2021-08-19 ENCOUNTER — Other Ambulatory Visit: Payer: Self-pay | Admitting: Family Medicine

## 2021-08-19 DIAGNOSIS — I7121 Aneurysm of the ascending aorta, without rupture: Secondary | ICD-10-CM

## 2021-09-06 ENCOUNTER — Other Ambulatory Visit: Payer: 59

## 2021-09-06 ENCOUNTER — Ambulatory Visit
Admission: RE | Admit: 2021-09-06 | Discharge: 2021-09-06 | Disposition: A | Discharge status: Home / Self Care | Payer: 59 | Source: Ambulatory Visit | Attending: Family Medicine | Admitting: Family Medicine

## 2021-09-06 ENCOUNTER — Other Ambulatory Visit: Payer: Self-pay

## 2021-09-06 DIAGNOSIS — I7121 Aneurysm of the ascending aorta, without rupture: Secondary | ICD-10-CM

## 2021-09-06 MED ORDER — GADOBENATE DIMEGLUMINE 529 MG/ML IV SOLN
17.0000 mL | Freq: Once | INTRAVENOUS | Status: AC | PRN
  Administered 2021-09-06: 17 mL via INTRAVENOUS

## 2021-10-27 IMAGING — MR MR MRA CHEST W/ OR W/O CM
12 series · 16 of 16 positions shown · IV contrast (multihance)
Comparison: CTA 06/07/2019

CLINICAL DATA: Ascending thoracic aortic aneurysm

EXAM:
MRA CHEST WITH OR WITHOUT CONTRAST
TECHNIQUE: Angiographic images of the chest were obtained using MRA technique
without and with intravenous contrast.
CONTRAST:  17mL MULTIHANCE GADOBENATE DIMEGLUMINE 529 MG/ML IV SOLN

[Series 2: bSSFP · axial · 5.0mm · 0.74mm/px · 1 of 48 slices shown (1 of 2)]
[im 1/48]
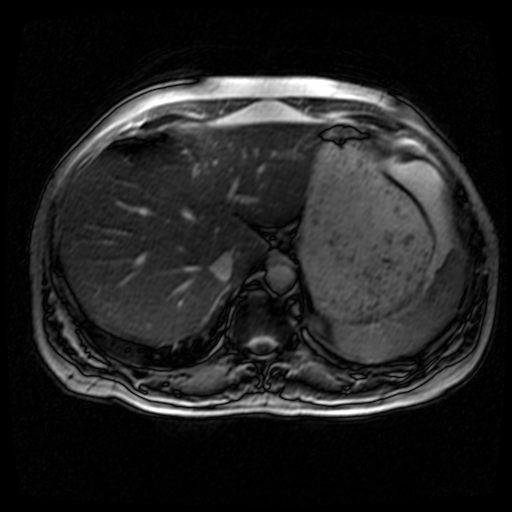

[Series 3: axial flash · axial · 5.0mm · 0.70mm/px · 1 of 52 slices shown]
[im 1/52]
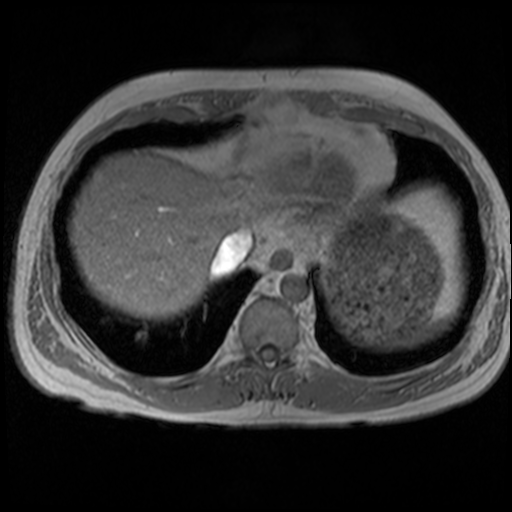

[Series 4: axial haste · axial · 5.0mm · 0.74mm/px · 1 of 55 slices shown]
[im 1/55]
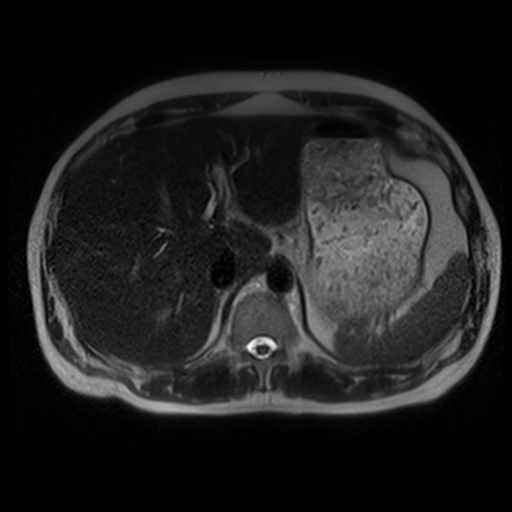

[Series 5: T1 dynamic · axial · non-contrast · 2.5mm · 0.78mm/px · z∈[-140,+137]mm · 2 of 112 slices shown]
[im 1/112]
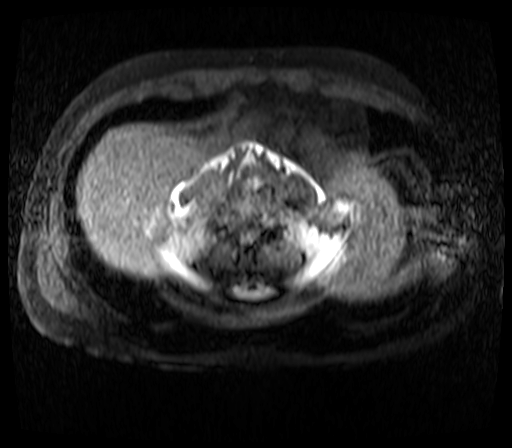
[im 112/112]
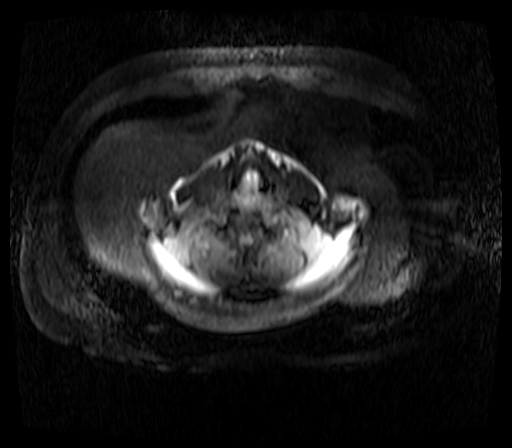

[Series 6: bSSFP · sagittal · 4.0mm · 0.68mm/px · 1 of 30 slices shown (2 of 2)]
[im 1/30]
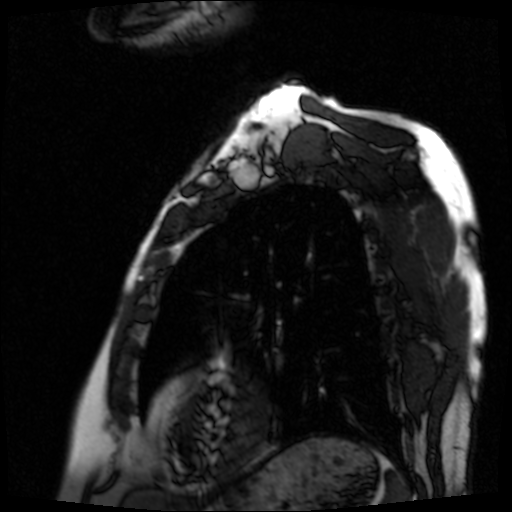

[Series 7: fl3d candy cane_tt=1.0s · sagittal · 1.5mm · 1.12mm/px · 1 of 80 slices shown]
[im 1/80]
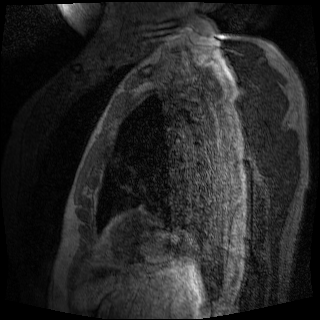

[Series 8: care bolus · oblique · 100.0mm · 1.76mm/px · 1 of 25 slices shown]
[im 1/25]
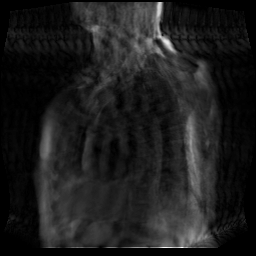

[Series 9: fl3d_cor candy cane_tt=1.0s · sagittal · 1.5mm · 1.12mm/px · 2 of 80 slices shown]
[im 1/80]
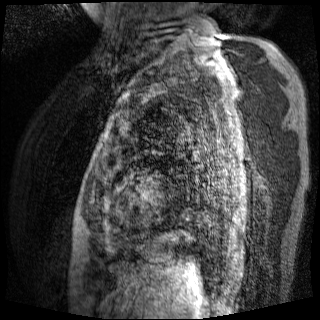
[im 80/80]
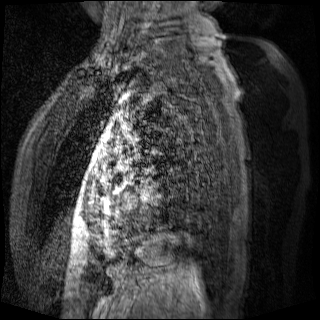

[Series 10: post axial flash · axial · 5.0mm · 0.70mm/px · 1 of 45 slices shown]
[im 1/45]
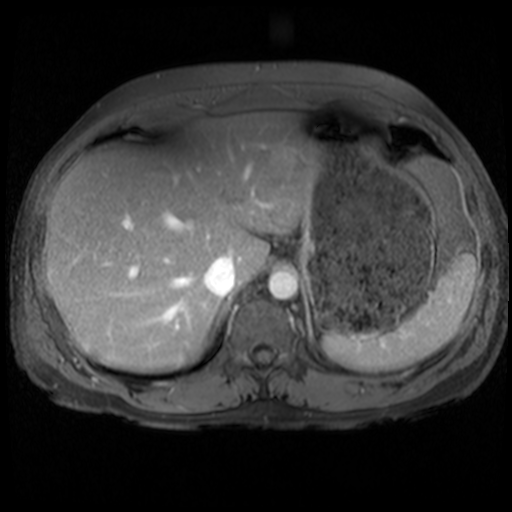

[Series 11: post flash candy · sagittal · 4.0mm · 0.70mm/px · 1 of 30 slices shown]
[im 1/30]
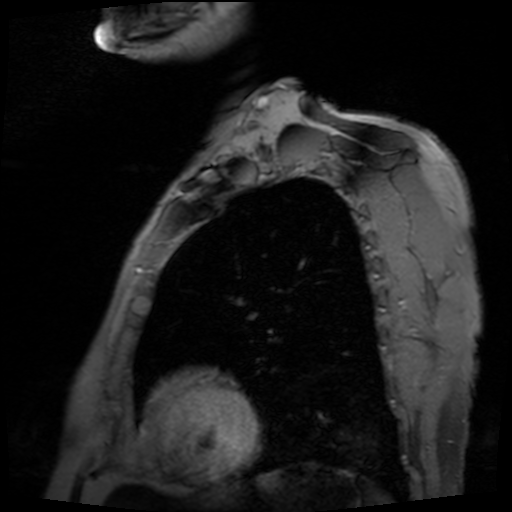

[Series 12: T1 dynamic post-contrast · axial · 2.5mm · 0.78mm/px · z∈[-140,+137]mm · 3 of 112 slices shown]
[im 1/112]
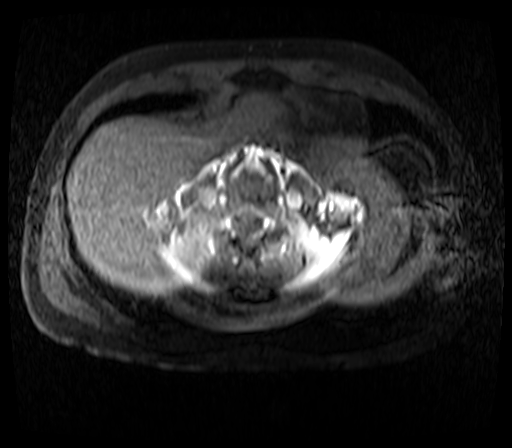
[im 56/112]
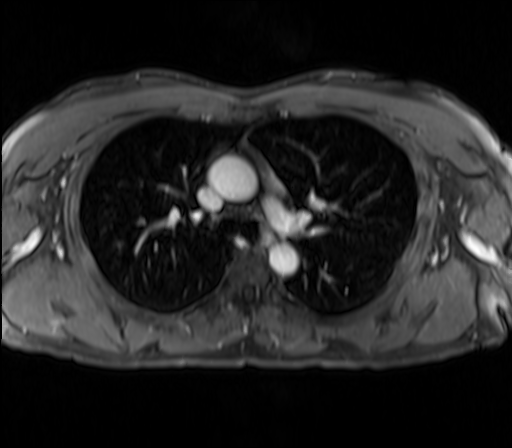
[im 112/112]
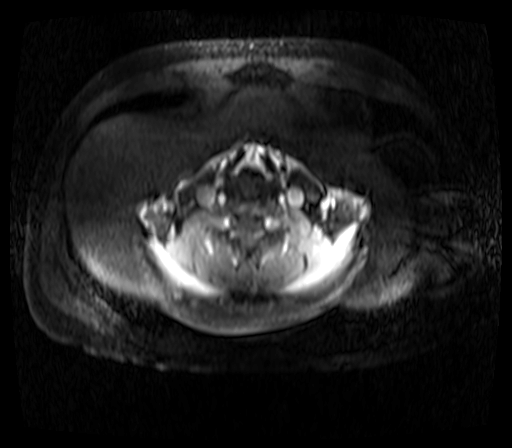

[Series 13: MRA · sagittal · 1.12mm/px · 1 of 13 slices shown]
[im 1/13]
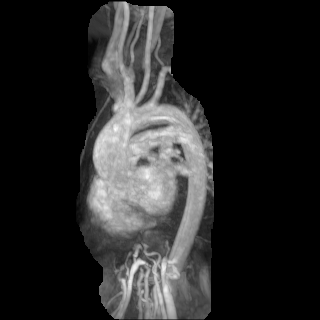

[16 of 16 positions shown; findings below may reference images not displayed]

FINDINGS: VASCULAR

Aorta: No dissection or stenosis.

Aortic Root:

--Valve: 1.9 cm

--Sinuses: 4.2 cm

--Sinotubular Junction: 3.7 cm

Limitations by motion: Moderate

Thoracic Aorta:

--Ascending Aorta: 4.0 cm (previously 4.1)

--Aortic Arch: 2.6 cm

--Descending Aorta: 2.4 cm

No significant atheromatous irregularity. Bovine variant
brachiocephalic arterial origin anatomy without proximal stenosis.
Visualized proximal abdominal aorta unremarkable.

Heart: Normal size.  No pericardial effusion.

Pulmonary Arteries:  Unremarkable centrally.

Other: No pleural effusion. Visualized portions of upper abdomen
unremarkable.

NON-VASCULAR

Spinal cord: Negative limited evaluation

Brachial plexus: No mass or acute findings

Muscles and tendons: Negative limited evaluation

Bones: Negative limited evaluation

Joints: Limited evaluation
IMPRESSION: 1. Mild 4 cm ascending thoracic aortic aneurysm without complicating
features. Recommend annual imaging followup by CTA or MRA. This
recommendation follows 7353
ACCF/AHA/AATS/ACR/ASA/SCA/ZELALEM YAZEW/KARPINSKI/DEUTSCH/PALMERIN Guidelines for the
Diagnosis and Management of Patients with Thoracic Aortic Disease.
Circulation. 7353; 121: [id number]

## 2023-02-09 ENCOUNTER — Other Ambulatory Visit: Payer: Self-pay | Admitting: Surgery

## 2023-05-04 ENCOUNTER — Encounter (HOSPITAL_BASED_OUTPATIENT_CLINIC_OR_DEPARTMENT_OTHER): Payer: Self-pay | Admitting: Surgery

## 2023-05-04 ENCOUNTER — Other Ambulatory Visit: Payer: Self-pay

## 2023-05-08 NOTE — Anesthesia Preprocedure Evaluation (Addendum)
Anesthesia Evaluation  Patient identified by MRN, date of birth, ID band Patient awake    Reviewed: Allergy & Precautions, NPO status , Patient's Chart, lab work & pertinent test results  Airway Mallampati: III  TM Distance: >3 FB Neck ROM: Full    Dental  (+) Teeth Intact, Dental Advisory Given   Pulmonary neg pulmonary ROS   Pulmonary exam normal breath sounds clear to auscultation       Cardiovascular hypertension (174/108, per pt white coat HTN), Pt. on medications Normal cardiovascular exam Rhythm:Regular Rate:Normal     Neuro/Psych negative neurological ROS  negative psych ROS   GI/Hepatic negative GI ROS, Neg liver ROS,,,  Endo/Other  negative endocrine ROS    Renal/GU negative Renal ROS  negative genitourinary   Musculoskeletal negative musculoskeletal ROS (+)    Abdominal   Peds  Hematology negative hematology ROS (+)   Anesthesia Other Findings   Reproductive/Obstetrics negative OB ROS                             Anesthesia Physical Anesthesia Plan  ASA: 2  Anesthesia Plan: General and Regional   Post-op Pain Management: Regional block*, Tylenol PO (pre-op)* and Toradol IV (intra-op)*   Induction: Intravenous  PONV Risk Score and Plan: 2 and Ondansetron, Dexamethasone, Midazolam and Treatment may vary due to age or medical condition  Airway Management Planned: LMA  Additional Equipment: None  Intra-op Plan:   Post-operative Plan: Extubation in OR  Informed Consent:   Plan Discussed with:   Anesthesia Plan Comments:        Anesthesia Quick Evaluation

## 2023-05-10 ENCOUNTER — Encounter (HOSPITAL_BASED_OUTPATIENT_CLINIC_OR_DEPARTMENT_OTHER)
Admission: RE | Admit: 2023-05-10 | Discharge: 2023-05-10 | Disposition: A | Discharge status: Home / Self Care | Payer: 59 | Source: Ambulatory Visit | Attending: Surgery | Admitting: Surgery

## 2023-05-10 DIAGNOSIS — Z01812 Encounter for preprocedural laboratory examination: Secondary | ICD-10-CM | POA: Insufficient documentation

## 2023-05-10 MED ORDER — CHLORHEXIDINE GLUCONATE CLOTH 2 % EX PADS: 6.0000 | MEDICATED_PAD | Freq: Once | CUTANEOUS | Status: DC

## 2023-05-10 MED ORDER — ENSURE PRE-SURGERY PO LIQD: 296.0000 mL | Freq: Once | ORAL | Status: DC

## 2023-05-13 NOTE — H&P (Signed)
  REFERRING PHYSICIAN: Hilts, Clayborne Artist, MD PROVIDER: Wayne Both, MD MRN: [id number] DOB: December 04, 1970  Subjective   Chief Complaint: New Consultation (LFT INGUINAL HERNIA)  History of Present Illness: Thomas Kramer is a 52 y.o. male who is seen  as an office consultation for evaluation of New Consultation (LFT INGUINAL HERNIA)  This is a pleasant 52 year old gentleman referred here for evaluation of a left inguinal hernia. He reports noticing the bulge several months ago. It causes no discomfort and no obstructive symptoms. He reports that he had bilateral hernias repaired as an infant. He is otherwise healthy without complaints. He has noticed no similar symptoms on his right inguinal area. He has no cardiopulmonary issues  Review of Systems: A complete review of systems was obtained from the patient. I have reviewed this information and discussed as appropriate with the patient. See HPI as well for other ROS.  ROS   Medical History: Past Medical History:  Diagnosis Date  Hypertension   There is no problem list on file for this patient.  Past Surgical History:  Procedure Laterality Date  HERNIA REPAIR    No Known Allergies  Current Outpatient Medications on File Prior to Visit  Medication Sig Dispense Refill  CHROMIUM ORAL Take 500 mcg by mouth once daily  losartan (COZAAR) 50 MG tablet Take 50 mg by mouth once daily  magnesium glycinate 100 mg Tab Take 120 mg by mouth   No current facility-administered medications on file prior to visit.   Family History  Problem Relation Age of Onset  Stroke Mother  High blood pressure (Hypertension) Mother  Coronary Artery Disease (Blocked arteries around heart) Father    Social History   Tobacco Use  Smoking Status Never  Smokeless Tobacco Never    Social History   Socioeconomic History  Marital status: Married  Tobacco Use  Smoking status: Never  Smokeless tobacco: Never  Vaping Use  Vaping status:  Never Used  Substance and Sexual Activity  Alcohol use: Yes  Drug use: Never   Objective:   Vitals:   BP: (!) 180/116  Pulse: 106  Temp: 36.9 C (98.4 F)  SpO2: 99%  Weight: 79.6 kg (175 lb 6.4 oz)  Height: 182.9 cm (6')  PainSc: 0-No pain   Body mass index is 23.79 kg/m.  Physical Exam   He appears well on exam  His abdomen is soft and nontender. There is an easily reducible left inguinal hernia which is nontender. There is no evidence of right inguinal hernia.  Labs, Imaging and Diagnostic Testing: I have reviewed the notes from his primary care provider  Assessment and Plan:   Diagnoses and all orders for this visit:  Left inguinal hernia   We discussed abdominal anatomy and hernias. We discussed conservative management with close follow-up versus hernia repair with mesh. I would recommend hernia repair given the size of the hernia. We next discussed proceeding with either a laparoscopic or open repair with mesh. I explained both procedures in detail. After discussion, he wishes to proceed with an open left inguinal hernia repair with mesh as an outpatient with a tap block by anesthesiology. I discussed the risk of surgery which includes but is not limited to bleeding, infection, injury to surrounding structures, use of mesh, hernia recurrence, nerve entrapment, chronic pain after surgery, cardiopulmonary issues with surgery, postoperative recovery, etc. He understands and wished to proceed with surgery which will be scheduled. We discussed signs and symptoms of incarceration or strangulation as well.

## 2023-05-14 ENCOUNTER — Ambulatory Visit (HOSPITAL_BASED_OUTPATIENT_CLINIC_OR_DEPARTMENT_OTHER)
Admission: RE | Admit: 2023-05-14 | Discharge: 2023-05-14 | Disposition: A | Discharge status: Home / Self Care | Payer: 59 | Attending: Surgery | Admitting: Surgery

## 2023-05-14 ENCOUNTER — Other Ambulatory Visit: Payer: Self-pay

## 2023-05-14 ENCOUNTER — Encounter (HOSPITAL_BASED_OUTPATIENT_CLINIC_OR_DEPARTMENT_OTHER): Payer: Self-pay | Admitting: Surgery

## 2023-05-14 ENCOUNTER — Encounter (HOSPITAL_BASED_OUTPATIENT_CLINIC_OR_DEPARTMENT_OTHER)
Admission: RE | Disposition: A | Discharge status: Home / Self Care | Payer: Self-pay | Source: Home / Self Care | Attending: Surgery

## 2023-05-14 ENCOUNTER — Ambulatory Visit (HOSPITAL_BASED_OUTPATIENT_CLINIC_OR_DEPARTMENT_OTHER): Payer: 59 | Admitting: Anesthesiology

## 2023-05-14 DIAGNOSIS — Z79899 Other long term (current) drug therapy: Secondary | ICD-10-CM | POA: Diagnosis not present

## 2023-05-14 DIAGNOSIS — I10 Essential (primary) hypertension: Secondary | ICD-10-CM | POA: Diagnosis not present

## 2023-05-14 DIAGNOSIS — K4090 Unilateral inguinal hernia, without obstruction or gangrene, not specified as recurrent: Secondary | ICD-10-CM

## 2023-05-14 DIAGNOSIS — Z01818 Encounter for other preprocedural examination: Secondary | ICD-10-CM

## 2023-05-14 HISTORY — PX: INGUINAL HERNIA REPAIR: SHX194

## 2023-05-14 SURGERY — REPAIR, HERNIA, INGUINAL, ADULT
Anesthesia: Regional | Site: Groin | Laterality: Left

## 2023-05-14 MED ORDER — MIDAZOLAM HCL 2 MG/2ML IJ SOLN
2.0000 mg | Freq: Once | INTRAMUSCULAR | Status: AC
  Administered 2023-05-14: 2 mg via INTRAVENOUS

## 2023-05-14 MED ORDER — CEFAZOLIN SODIUM-DEXTROSE 2-4 GM/100ML-% IV SOLN
INTRAVENOUS | Status: AC
  Filled 2023-05-14: qty 100

## 2023-05-14 MED ORDER — OXYCODONE HCL 5 MG PO TABS
ORAL_TABLET | ORAL | Status: AC
  Filled 2023-05-14: qty 1

## 2023-05-14 MED ORDER — FENTANYL CITRATE (PF) 100 MCG/2ML IJ SOLN
100.0000 ug | Freq: Once | INTRAMUSCULAR | Status: AC
  Administered 2023-05-14: 100 ug via INTRAVENOUS

## 2023-05-14 MED ORDER — LACTATED RINGERS IV SOLN: INTRAVENOUS | Status: DC | PRN

## 2023-05-14 MED ORDER — DEXMEDETOMIDINE HCL IN NACL 200 MCG/50ML IV SOLN
INTRAVENOUS | Status: DC | PRN
  Administered 2023-05-14: 12 ug via INTRAVENOUS
  Administered 2023-05-14: 8 ug via INTRAVENOUS

## 2023-05-14 MED ORDER — KETOROLAC TROMETHAMINE 30 MG/ML IJ SOLN: 30.0000 mg | Freq: Once | INTRAMUSCULAR | Status: DC | PRN

## 2023-05-14 MED ORDER — CEFAZOLIN SODIUM-DEXTROSE 2-3 GM-%(50ML) IV SOLR
INTRAVENOUS | Status: DC | PRN
  Administered 2023-05-14: 2 g via INTRAVENOUS

## 2023-05-14 MED ORDER — PROPOFOL 10 MG/ML IV BOLUS
INTRAVENOUS | Status: DC | PRN
  Administered 2023-05-14: 200 mg via INTRAVENOUS

## 2023-05-14 MED ORDER — OXYCODONE HCL 5 MG PO TABS: 5.0000 mg | ORAL_TABLET | Freq: Four times a day (QID) | ORAL | 0 refills | Status: AC | PRN

## 2023-05-14 MED ORDER — ONDANSETRON HCL 4 MG/2ML IJ SOLN
INTRAMUSCULAR | Status: AC
Start: 2023-05-14 — End: ?
  Filled 2023-05-14: qty 2

## 2023-05-14 MED ORDER — DEXAMETHASONE SODIUM PHOSPHATE 10 MG/ML IJ SOLN
INTRAMUSCULAR | Status: AC
  Filled 2023-05-14: qty 1

## 2023-05-14 MED ORDER — OXYCODONE HCL 5 MG/5ML PO SOLN: 5.0000 mg | Freq: Once | ORAL | Status: AC | PRN

## 2023-05-14 MED ORDER — HYDROMORPHONE HCL 1 MG/ML IJ SOLN: 0.2500 mg | INTRAMUSCULAR | Status: DC | PRN

## 2023-05-14 MED ORDER — BUPIVACAINE-EPINEPHRINE 0.5% -1:200000 IJ SOLN
INTRAMUSCULAR | Status: DC | PRN
  Administered 2023-05-14: 10 mL

## 2023-05-14 MED ORDER — LACTATED RINGERS IV SOLN
INTRAVENOUS | Status: DC
Start: 2023-05-14 — End: 2023-05-14

## 2023-05-14 MED ORDER — OXYCODONE HCL 5 MG PO TABS
5.0000 mg | ORAL_TABLET | Freq: Once | ORAL | Status: AC | PRN
  Administered 2023-05-14: 5 mg via ORAL

## 2023-05-14 MED ORDER — FENTANYL CITRATE (PF) 100 MCG/2ML IJ SOLN
INTRAMUSCULAR | Status: AC
  Filled 2023-05-14: qty 2

## 2023-05-14 MED ORDER — ONDANSETRON HCL 4 MG/2ML IJ SOLN: 4.0000 mg | Freq: Once | INTRAMUSCULAR | Status: DC | PRN

## 2023-05-14 MED ORDER — DEXAMETHASONE SODIUM PHOSPHATE 10 MG/ML IJ SOLN
INTRAMUSCULAR | Status: DC | PRN
  Administered 2023-05-14: 5 mg via INTRAVENOUS

## 2023-05-14 MED ORDER — MEPERIDINE HCL 25 MG/ML IJ SOLN: 6.2500 mg | INTRAMUSCULAR | Status: DC | PRN

## 2023-05-14 MED ORDER — ACETAMINOPHEN 500 MG PO TABS
ORAL_TABLET | ORAL | Status: AC
Start: 2023-05-14 — End: ?
  Filled 2023-05-14: qty 2

## 2023-05-14 MED ORDER — CEFAZOLIN SODIUM-DEXTROSE 2-4 GM/100ML-% IV SOLN: 2.0000 g | INTRAVENOUS | Status: DC

## 2023-05-14 MED ORDER — ONDANSETRON HCL 4 MG/2ML IJ SOLN
INTRAMUSCULAR | Status: DC | PRN
  Administered 2023-05-14: 4 mg via INTRAVENOUS

## 2023-05-14 MED ORDER — ROPIVACAINE HCL 5 MG/ML IJ SOLN
INTRAMUSCULAR | Status: DC | PRN
  Administered 2023-05-14: 20 mL via PERINEURAL

## 2023-05-14 MED ORDER — FENTANYL CITRATE (PF) 100 MCG/2ML IJ SOLN
INTRAMUSCULAR | Status: DC | PRN
  Administered 2023-05-14 (×2): 50 ug via INTRAVENOUS

## 2023-05-14 MED ORDER — AMISULPRIDE (ANTIEMETIC) 5 MG/2ML IV SOLN: 10.0000 mg | Freq: Once | INTRAVENOUS | Status: DC | PRN

## 2023-05-14 MED ORDER — BUPIVACAINE LIPOSOME 1.3 % IJ SUSP
INTRAMUSCULAR | Status: DC | PRN
  Administered 2023-05-14: 10 mL via PERINEURAL

## 2023-05-14 MED ORDER — BUPIVACAINE-EPINEPHRINE (PF) 0.5% -1:200000 IJ SOLN
INTRAMUSCULAR | Status: AC
  Filled 2023-05-14: qty 120

## 2023-05-14 MED ORDER — PROPOFOL 10 MG/ML IV BOLUS
INTRAVENOUS | Status: AC
Start: 2023-05-14 — End: ?
  Filled 2023-05-14: qty 20

## 2023-05-14 MED ORDER — LIDOCAINE HCL (CARDIAC) PF 100 MG/5ML IV SOSY
PREFILLED_SYRINGE | INTRAVENOUS | Status: DC | PRN
  Administered 2023-05-14: 60 mg via INTRAVENOUS

## 2023-05-14 MED ORDER — PROPOFOL 10 MG/ML IV BOLUS
INTRAVENOUS | Status: AC
  Filled 2023-05-14: qty 20

## 2023-05-14 MED ORDER — MIDAZOLAM HCL 2 MG/2ML IJ SOLN
INTRAMUSCULAR | Status: AC
  Filled 2023-05-14: qty 2

## 2023-05-14 MED ORDER — ACETAMINOPHEN 500 MG PO TABS
1000.0000 mg | ORAL_TABLET | Freq: Once | ORAL | Status: AC
  Administered 2023-05-14: 1000 mg via ORAL

## 2023-05-14 MED ORDER — LIDOCAINE 2% (20 MG/ML) 5 ML SYRINGE
INTRAMUSCULAR | Status: AC
  Filled 2023-05-14: qty 5

## 2023-05-14 SURGICAL SUPPLY — 41 items
BLADE CLIPPER SURG (BLADE) ×1 IMPLANT
BLADE SURG 15 STRL LF DISP TIS (BLADE) ×1 IMPLANT
BLADE SURG 15 STRL SS (BLADE) ×1
CANISTER SUCT 1200ML W/VALVE (MISCELLANEOUS) IMPLANT
CHLORAPREP W/TINT 26 (MISCELLANEOUS) ×1 IMPLANT
COVER BACK TABLE 60X90IN (DRAPES) ×1 IMPLANT
COVER MAYO STAND STRL (DRAPES) ×1 IMPLANT
DERMABOND ADVANCED .7 DNX12 (GAUZE/BANDAGES/DRESSINGS) ×1 IMPLANT
DRAIN PENROSE .5X12 LATEX STL (DRAIN) ×1 IMPLANT
DRAPE LAPAROTOMY 100X72 PEDS (DRAPES) ×1 IMPLANT
DRAPE UTILITY XL STRL (DRAPES) ×1 IMPLANT
ELECT REM PT RETURN 9FT ADLT (ELECTROSURGICAL) ×1
ELECTRODE REM PT RTRN 9FT ADLT (ELECTROSURGICAL) ×1 IMPLANT
GLOVE BIO SURGEON STRL SZ 6.5 (GLOVE) IMPLANT
GLOVE BIOGEL PI IND STRL 6.5 (GLOVE) IMPLANT
GLOVE SURG SIGNA 7.5 PF LTX (GLOVE) ×1 IMPLANT
GOWN STRL REUS W/ TWL LRG LVL3 (GOWN DISPOSABLE) ×1 IMPLANT
GOWN STRL REUS W/ TWL XL LVL3 (GOWN DISPOSABLE) ×1 IMPLANT
GOWN STRL REUS W/TWL LRG LVL3 (GOWN DISPOSABLE) ×1
GOWN STRL REUS W/TWL XL LVL3 (GOWN DISPOSABLE) ×1
MESH PARIETEX PROGRIP LEFT (Mesh General) IMPLANT
NDL HYPO 25X1 1.5 SAFETY (NEEDLE) ×1 IMPLANT
NEEDLE HYPO 25X1 1.5 SAFETY (NEEDLE) ×1
NS IRRIG 1000ML POUR BTL (IV SOLUTION) IMPLANT
PACK BASIN DAY SURGERY FS (CUSTOM PROCEDURE TRAY) ×1 IMPLANT
PENCIL SMOKE EVACUATOR (MISCELLANEOUS) ×1 IMPLANT
SLEEVE SCD COMPRESS KNEE MED (STOCKING) ×1 IMPLANT
SPIKE FLUID TRANSFER (MISCELLANEOUS) IMPLANT
SPONGE INTESTINAL PEANUT (DISPOSABLE) IMPLANT
SPONGE T-LAP 4X18 ~~LOC~~+RFID (SPONGE) ×1 IMPLANT
SUT MNCRL AB 4-0 PS2 18 (SUTURE) ×1 IMPLANT
SUT SILK 2 0 SH (SUTURE) IMPLANT
SUT VIC AB 2-0 CT1 27 (SUTURE) ×2
SUT VIC AB 2-0 CT1 TAPERPNT 27 (SUTURE) ×2 IMPLANT
SUT VIC AB 3-0 CT1 27 (SUTURE) ×1
SUT VIC AB 3-0 CT1 27XBRD (SUTURE) ×1 IMPLANT
SYR BULB EAR ULCER 3OZ GRN STR (SYRINGE) IMPLANT
SYR CONTROL 10ML LL (SYRINGE) ×1 IMPLANT
TOWEL GREEN STERILE FF (TOWEL DISPOSABLE) ×1 IMPLANT
TUBE CONNECTING 20X1/4 (TUBING) IMPLANT
YANKAUER SUCT BULB TIP NO VENT (SUCTIONS) IMPLANT

## 2023-05-14 NOTE — Transfer of Care (Signed)
Immediate Anesthesia Transfer of Care Note  Patient: Thomas Kramer  Procedure(s) Performed: OPEN LEFT INGUINAL HERNIA REPAIR WITH MESH (Left: Groin)  Patient Location: PACU  Anesthesia Type:General and Regional  Level of Consciousness: awake, alert , and patient cooperative  Airway & Oxygen Therapy: Patient Spontanous Breathing and Patient connected to face mask oxygen  Post-op Assessment: Report given to RN and Post -op Vital signs reviewed and stable  Post vital signs: Reviewed and stable  Last Vitals:  Vitals Value Taken Time  BP    Temp    Pulse 102 05/14/23 0810  Resp 18 05/14/23 0810  SpO2 97 % 05/14/23 0810  Vitals shown include unfiled device data.  Last Pain:  Vitals:   05/14/23 0629  TempSrc: Temporal  PainSc: 0-No pain      Patients Stated Pain Goal: 3 (05/14/23 1610)  Complications: No notable events documented.

## 2023-05-14 NOTE — Anesthesia Procedure Notes (Signed)
Anesthesia Regional Block: TAP block   Pre-Anesthetic Checklist: , timeout performed,  Correct Patient, Correct Site, Correct Laterality,  Correct Procedure, Correct Position, site marked,  Risks and benefits discussed,  Surgical consent,  Pre-op evaluation,  At surgeon's request and post-op pain management  Laterality: Left  Prep: Maximum Sterile Barrier Precautions used, chloraprep       Needles:  Injection technique: Single-shot  Needle Type: Echogenic Stimulator Needle     Needle Length: 9cm  Needle Gauge: 22     Additional Needles:   Procedures:,,,, ultrasound used (permanent image in chart),,    Narrative:  Start time: 05/14/2023 6:55 AM End time: 05/14/2023 7:00 AM Injection made incrementally with aspirations every 5 mL.  Performed by: Personally  Anesthesiologist: Lannie Fields, DO  Additional Notes: Monitors applied. No increased pain on injection. No increased resistance to injection. Injection made in 5cc increments. Good needle visualization. Patient tolerated procedure well.

## 2023-05-14 NOTE — Discharge Instructions (Addendum)
[facility]  UMBILICAL OR INGUINAL HERNIA REPAIR: POST OP INSTRUCTIONS  Always review your discharge instruction sheet given to you by the facility where your surgery was performed. IF YOU HAVE DISABILITY OR FAMILY LEAVE FORMS, YOU MUST BRING THEM TO THE OFFICE FOR PROCESSING.   DO NOT GIVE THEM TO YOUR DOCTOR.  1. A  prescription for pain medication may be given to you upon discharge.  Take your pain medication as prescribed, if needed.  If narcotic pain medicine is not needed, then you may take acetaminophen (Tylenol) or ibuprofen (Advil) as needed. 2. Take your usually prescribed medications unless otherwise directed. If you need a refill on your pain medication, please contact your pharmacy.  They will contact our office to request authorization. Prescriptions will not be filled after 5 pm or on week-ends. 3. You should follow a light diet the first 24 hours after arrival home, such as soup and crackers, etc.  Be sure to include lots of fluids daily.  Resume your normal diet the day after surgery. 4.Most patients will experience some swelling and bruising around the umbilicus or in the groin and scrotum.  Ice packs and reclining will help.  Swelling and bruising can take several days to resolve.  6. It is common to experience some constipation if taking pain medication after surgery.  Increasing fluid intake and taking a stool softener (such as Colace) will usually help or prevent this problem from occurring.  A mild laxative (Milk of Magnesia or Miralax) should be taken according to package directions if there are no bowel movements after 48 hours. 7. Unless discharge instructions indicate otherwise, you may remove your bandages 24-48 hours after surgery, and you may shower at that time.  You may have steri-strips (small skin tapes) in place directly over the incision.  These strips should be left on the skin for 7-10 days.  If your surgeon used skin glue on the  incision, you may shower in 24 hours.  The glue will flake off over the next 2-3 weeks.  Any sutures or staples will be removed at the office during your follow-up visit. 8. ACTIVITIES:  You may resume regular (light) daily activities beginning the next day--such as daily self-care, walking, climbing stairs--gradually increasing activities as tolerated.  You may have sexual intercourse when it is comfortable.  Refrain from any heavy lifting or straining until approved by your doctor.  a.You may drive when you are no longer taking prescription pain medication, you can comfortably wear a seatbelt, and you can safely maneuver your car and apply brakes. b.RETURN TO WORK:   _____________________________________________  9.You should see your doctor in the office for a follow-up appointment approximately 2-3 weeks after your surgery.  Make sure that you call for this appointment within a day or two after you arrive home to insure a convenient appointment time. 10.OTHER INSTRUCTIONS: _YOU MAY SHOWER STARTING TOMORROW ICE PACK, TYLENOL, AND IBUPROFEN ALSO FOR PAIN NO LIFTING MORE THAN 15 POUNDS FOR 4 WEEKS___________________ Next tylenol dose after 12:30pm_____    _____________________________________  WHEN TO CALL YOUR DOCTOR: Fever over 101.0 Inability to urinate Nausea and/or vomiting Extreme swelling or bruising Continued bleeding from incision. Increased pain, redness, or drainage from the incision  The clinic staff is available to answer your questions during regular business hours.  Please don't hesitate to call and ask to speak to one of the nurses for clinical conc Post Anesthesia Home Care Instructions  Activity: Get plenty of rest for  the remainder of the day. A responsible individual must stay with you for 24 hours following the procedure.  For the next 24 hours, DO NOT: -Drive a car -Advertising copywriter -Drink alcoholic beverages -Take any medication unless instructed by your  physician -Make any legal decisions or sign important papers.  Meals: Start with liquid foods such as gelatin or soup. Progress to regular foods as tolerated. Avoid greasy, spicy, heavy foods. If nausea and/or vomiting occur, drink only clear liquids until the nausea and/or vomiting subsides. Call your physician if vomiting continues.  Special Instructions/Symptoms: Your throat may feel dry or sore from the anesthesia or the breathing tube placed in your throat during surgery. If this causes discomfort, gargle with warm salt water. The discomfort should disappear within 24 hours.  If you had a scopolamine patch placed behind your ear for the management of post- operative nausea and/or vomiting:  1. The medication in the patch is effective for 72 hours, after which it should be removed.  Wrap patch in a tissue and discard in the trash. Wash hands thoroughly with soap and water. 2. You may remove the patch earlier than 72 hours if you experience unpleasant side effects which may include dry mouth, dizziness or visual disturbances. 3. Avoid touching the patch. Wash your hands with soap and water after contact with the patch.    Post Anesthesia Home Care Instructions  Activity: Get plenty of rest for the remainder of the day. A responsible individual must stay with you for 24 hours following the procedure.  For the next 24 hours, DO NOT: -Drive a car -Advertising copywriter -Drink alcoholic beverages -Take any medication unless instructed by your physician -Make any legal decisions or sign important papers.  Meals: Start with liquid foods such as gelatin or soup. Progress to regular foods as tolerated. Avoid greasy, spicy, heavy foods. If nausea and/or vomiting occur, drink only clear liquids until the nausea and/or vomiting subsides. Call your physician if vomiting continues.  Special Instructions/Symptoms: Your throat may feel dry or sore from the anesthesia or the breathing tube placed in  your throat during surgery. If this causes discomfort, gargle with warm salt water. The discomfort should disappear within 24 hours.  If you had a scopolamine patch placed behind your ear for the management of post- operative nausea and/or vomiting:  1. The medication in the patch is effective for 72 hours, after which it should be removed.  Wrap patch in a tissue and discard in the trash. Wash hands thoroughly with soap and water. 2. You may remove the patch earlier than 72 hours if you experience unpleasant side effects which may include dry mouth, dizziness or visual disturbances. 3. Avoid touching the patch. Wash your hands with soap and water after contact with the patch.    Post Anesthesia Home Care Instructions  Activity: Get plenty of rest for the remainder of the day. A responsible individual must stay with you for 24 hours following the procedure.  For the next 24 hours, DO NOT: -Drive a car -Advertising copywriter -Drink alcoholic beverages -Take any medication unless instructed by your physician -Make any legal decisions or sign important papers.  Meals: Start with liquid foods such as gelatin or soup. Progress to regular foods as tolerated. Avoid greasy, spicy, heavy foods. If nausea and/or vomiting occur, drink only clear liquids until the nausea and/or vomiting subsides. Call your physician if vomiting continues.  Special Instructions/Symptoms: Your throat may feel dry or sore from the anesthesia or the  breathing tube placed in your throat during surgery. If this causes discomfort, gargle with warm salt water. The discomfort should disappear within 24 hours.  If you had a scopolamine patch placed behind your ear for the management of post- operative nausea and/or vomiting:  1. The medication in the patch is effective for 72 hours, after which it should be removed.  Wrap patch in a tissue and discard in the trash. Wash hands thoroughly with soap and water. 2. You may remove the  patch earlier than 72 hours if you experience unpleasant side effects which may include dry mouth, dizziness or visual disturbances. 3. Avoid touching the patch. Wash your hands with soap and water after contact with the patch.   erns.  If you have a medical emergency, go to the nearest emergency room or call 911.  A surgeon from [facility] is always on call at the hospital   [address], [address], [place], [place]  [address] ?  [address] [address], [place], [place]   [address] [phone] ? [phone] ? FAX [phone] Web site: [email or web address]

## 2023-05-14 NOTE — Progress Notes (Signed)
Assisted Dr. Doroteo Glassman with left, transabdominal plane, ultrasound guided block. Side rails up, monitors on throughout procedure. See vital signs in flow sheet. Tolerated Procedure well.

## 2023-05-14 NOTE — Anesthesia Procedure Notes (Signed)
Procedure Name: LMA Insertion Date/Time: 05/14/2023 7:45 AM  Performed by: Karen Kitchens, CRNAPre-anesthesia Checklist: Patient identified, Emergency Drugs available, Suction available and Patient being monitored Patient Re-evaluated:Patient Re-evaluated prior to induction Oxygen Delivery Method: Circle system utilized Preoxygenation: Pre-oxygenation with 100% oxygen Induction Type: IV induction Ventilation: Mask ventilation without difficulty LMA: LMA inserted LMA Size: 5.0 Number of attempts: 1 Airway Equipment and Method: Bite block Placement Confirmation: positive ETCO2, CO2 detector and breath sounds checked- equal and bilateral Tube secured with: Tape Dental Injury: Teeth and Oropharynx as per pre-operative assessment

## 2023-05-14 NOTE — Op Note (Signed)
OPEN LEFT INGUINAL HERNIA REPAIR WITH MESH  Procedure Note  Thomas Kramer 05/14/2023   Pre-op Diagnosis: left inguinal hernia     Post-op Diagnosis: same  Procedure(s): OPEN LEFT INGUINAL HERNIA REPAIR WITH MESH  Surgeon(s): [name], MD  Anesthesia: General  Staff:  Circulator: Maryan Rued, RN Scrub Person: Wardell Heath, CST  Estimated Blood Loss: Minimal               Findings: The patient was found to have a direct left inguinal hernia which was repaired with a large piece of Prolene ProGrip mesh from [employer]  Procedure: The patient was brought to the operating room and identified as correct patient.  He was placed upon on the operating room table and general anesthesia was induced.  His abdomen was then prepped and draped in the usual sterile fashion.  I anesthetized the skin in the left lower inguinal area with Marcaine.  I then made a longitudinal incision with a scalpel.  I then dissected down through Scarpa's fascia with the cautery.  I could easily feel the direct hernia defect.  I opened up the external oblique fascia toward the internal and external rings.  I then controlled the testicular cord with a Penrose drain.  I evaluated the cord structures and saw no evidence of indirect hernia.  Next a piece of large Prolene ProGrip mesh was brought to the field.  I placed it against the pubic tubercle and then brought around the cord structures and internal ring and then back toward the pubic tubercle.  I then sutured the mesh in place to the pubic tubercle, the transversalis fascia, and the shelving edge of the inguinal ligament with interrupted 2-0 Vicryl sutures.  Wide coverage of the inguinal floor and direct defect as well as the internal ring appeared to be achieved.  Hemostasis appeared to be achieved.  I closed the external oblique fascia over the top of the mesh with a running 2-0 Vicryl suture.  Scarpa's fascia was then closed with interrupted 3-0 Vicryl  sutures and the skin was closed with a running 4-0 Monocryl.  Dermabond was then applied.  The patient tolerated the procedure well.  All the counts were correct at the end of the procedure.  The patient was then extubated in the operating room and taken in a stable condition to the recovery room.          [name]   Date: 05/14/2023  Time: 8:04 AM

## 2023-05-14 NOTE — Interval H&P Note (Signed)
History and Physical Interval Note: no change in H and  P  05/14/2023 7:08 AM  Thomas Kramer  has presented today for surgery, with the diagnosis of left inguinal hernia.  The various methods of treatment have been discussed with the patient and family. After consideration of risks, benefits and other options for treatment, the patient has consented to  Procedure(s) with comments: OPEN LEFT INGUINAL HERNIA REPAIR WITH MESH (Left) - LMA AND TAP BLOCK as a surgical intervention.  The patient's history has been reviewed, patient examined, no change in status, stable for surgery.  I have reviewed the patient's chart and labs.  Questions were answered to the patient's satisfaction.     [name]

## 2023-05-14 NOTE — Anesthesia Postprocedure Evaluation (Signed)
Anesthesia Post Note  Patient: Thomas Kramer  Procedure(s) Performed: OPEN LEFT INGUINAL HERNIA REPAIR WITH MESH (Left: Groin)     Patient location during evaluation: PACU Anesthesia Type: Regional and General Level of consciousness: awake and alert, oriented and patient cooperative Pain management: pain level controlled Vital Signs Assessment: post-procedure vital signs reviewed and stable Respiratory status: spontaneous breathing, nonlabored ventilation and respiratory function stable Cardiovascular status: blood pressure returned to baseline and stable Postop Assessment: no apparent nausea or vomiting Anesthetic complications: no   No notable events documented.  Last Vitals:  Vitals:   05/14/23 0830 05/14/23 0847  BP: (!) 142/102 (!) 148/101  Pulse: 91 84  Resp: 12   Temp:  36.9 C  SpO2: 97% 97%    Last Pain:  Vitals:   05/14/23 0847  TempSrc: Temporal  PainSc: 4                  Lannie Fields

## 2023-05-15 ENCOUNTER — Encounter (HOSPITAL_BASED_OUTPATIENT_CLINIC_OR_DEPARTMENT_OTHER): Payer: Self-pay | Admitting: Surgery
